# Patient Record
Sex: Male | Born: 1945 | Race: White | Hispanic: No | Marital: Married | Smoking: Former smoker
Health system: Southern US, Community
[De-identification: ages and names within clinical notes are randomized; demographics above are authoritative.]

## PROBLEM LIST (undated history)

## (undated) DIAGNOSIS — I2510 Atherosclerotic heart disease of native coronary artery without angina pectoris: Secondary | ICD-10-CM

## (undated) HISTORY — DX: Atherosclerotic heart disease of native coronary artery without angina pectoris: I25.10

---

## 2001-05-06 ENCOUNTER — Encounter: Payer: Self-pay | Admitting: *Deleted

## 2001-05-06 ENCOUNTER — Ambulatory Visit (HOSPITAL_COMMUNITY): Admission: RE | Admit: 2001-05-06 | Discharge: 2001-05-07 | Payer: Self-pay | Admitting: *Deleted

## 2001-05-06 HISTORY — PX: CARDIAC CATHETERIZATION: SHX172

## 2001-06-01 ENCOUNTER — Encounter (HOSPITAL_COMMUNITY): Admission: RE | Admit: 2001-06-01 | Discharge: 2001-06-28 | Payer: Self-pay | Admitting: *Deleted

## 2004-04-25 ENCOUNTER — Emergency Department (HOSPITAL_COMMUNITY): Admission: EM | Admit: 2004-04-25 | Discharge: 2004-04-25 | Payer: Self-pay | Admitting: Emergency Medicine

## 2004-11-25 HISTORY — PX: TRANSTHORACIC ECHOCARDIOGRAM: SHX275

## 2008-08-01 ENCOUNTER — Encounter: Admission: RE | Admit: 2008-08-01 | Discharge: 2008-08-01 | Payer: Self-pay | Admitting: Cardiovascular Disease

## 2008-08-03 ENCOUNTER — Inpatient Hospital Stay (HOSPITAL_COMMUNITY): Admission: RE | Admit: 2008-08-03 | Discharge: 2008-08-04 | Payer: Self-pay | Admitting: Cardiovascular Disease

## 2008-08-03 HISTORY — PX: CARDIAC CATHETERIZATION: SHX172

## 2010-08-29 LAB — CBC
HCT: 38.2 % — ABNORMAL LOW (ref 39.0–52.0)
MCHC: 34.5 g/dL (ref 30.0–36.0)
MCHC: 35.1 g/dL (ref 30.0–36.0)
MCV: 89.8 fL (ref 78.0–100.0)
MCV: 89.9 fL (ref 78.0–100.0)
Platelets: 214 10*3/uL (ref 150–400)
Platelets: 218 10*3/uL (ref 150–400)
RBC: 4.06 MIL/uL — ABNORMAL LOW (ref 4.22–5.81)
WBC: 6.5 10*3/uL (ref 4.0–10.5)
WBC: 7.9 10*3/uL (ref 4.0–10.5)

## 2010-08-29 LAB — BASIC METABOLIC PANEL
BUN: 9 mg/dL (ref 6–23)
Calcium: 8.4 mg/dL (ref 8.4–10.5)
Chloride: 106 mEq/L (ref 96–112)
Creatinine, Ser: 1.19 mg/dL (ref 0.4–1.5)
GFR calc Af Amer: >60 mL/min (ref >60)

## 2010-10-01 NOTE — Discharge Summary (Signed)
[name], JEFF NO.:  [id number]   MEDICAL RECORD NO.:  [id number]          PATIENT TYPE:  INP   LOCATION:  2501                         FACILITY:  [facility]   PHYSICIAN:  Nicki Guadalajara, M.D.     DATE OF BIRTH:  19-Oct-1945   DATE OF ADMISSION:  08/03/2008  DATE OF DISCHARGE:  08/04/2008                               DISCHARGE SUMMARY   ADDENDUM   Previously, the patient had been on ACE inhibitor, then had syncope and  it was discontinued, this was in 2007, so we may revisit ACE inhibitor  as an outpatient, but not currently due to syncope.  Additionally,  Protonix 40 mg 1 daily at noon.  He was actually started on this on his  office visit and also the patient is a Naval architect.  Dr. Tresa Endo does not  want him going back to work until he is seen back in the office.  We  cancelled the April 12th appointment as this would be pretty far out for  the patient and he will see Dr. Tresa Endo within 1-2 weeks and the office  will call him with the date and time.      Darcella Gasman. Annie Paras, N.P.    ______________________________  Nicki Guadalajara, M.D.    LRI/MEDQ  D:  08/04/2008  T:  08/05/2008  Job:  [id number]

## 2010-10-01 NOTE — Cardiovascular Report (Signed)
NAME:  [name], [name] NO.:  [id number]   MEDICAL RECORD NO.:  [id number]          PATIENT TYPE:  OIB   LOCATION:  2501                         FACILITY:  [facility]   PHYSICIAN:  Nicki Guadalajara, M.D.     DATE OF BIRTH:  April 03, 1946   DATE OF PROCEDURE:  08/03/2008  DATE OF DISCHARGE:                            CARDIAC CATHETERIZATION   INDICATIONS:  Mr. [name] is a 65 year old gentleman who has known  established coronary artery disease.  In 2002, he underwent stenting of  his mid right coronary artery by Dr. Katrinka Blazing.  At that time, he was also  found to have 40-50% lesion in the mid LAD, 30% lesion in the circumflex  beyond the second obtuse marginal vessel and he also had mild narrowing  beyond the stented segment in his right coronary artery.  He has been  treated medically ever since.  Recently, he has noticed recurrent  episodes of chest discomfort.  Because of symptomatology similar to  2002, definitive repeat cardiac catheterization was recommended.   PROCEDURE:  After premedication with Valium 5 mg intravenously, the  patient was prepped and draped in usual fashion.  His right femoral  artery was punctured anteriorly and a 5-French sheath was inserted  without difficulty.  Diagnostic catheterization was done utilizing 5-  [place], Judkins 4 left and right coronary catheters.  A 5-French pigtail  catheter was used for biplane cine left ventriculography as well as  distal aortography.  At this time, his cineangiograms were reviewed with  his prior study from 2002.  With the demonstration of significant  progression of LAD disease, particularly at mid LAD level but also  concern that there was high-grade ostial LAD disease, decision was made  to attempt intervention to the LAD system.  Of note, the stent in the  RCA was still patent, but he still had narrowing beyond the stented  segment which was smooth and was not felt to be his culprit.  For this  reason,  attention was directed at the LAD system.  His arterial sheath  was upgraded to a 6-French system.  He received additional Valium with  fentanyl for sedation and back discomfort.  Double bolus Integrilin and  5700 units of weight-adjusted heparin were administered.  A 600 mg of  oral Plavix was given.  During his initial part of the test, the patient  did also receive intracoronary nitroglycerin.  Next, the 4.0 LAD guide  was used and 6-French for the interventional procedure.  With the  demonstration of therapeutic anticoagulation, Prowater wire was advanced  down the LAD system.  Particularly in the spider view, it was apparent  that there was 80% eccentric ostial LAD narrowing.  For this reason, it  was felt that if predilatation was to be done this may be best done with  utilization of cutting balloon atherotomy rather than PTCA to reduce  potential for plaque shifting, particularly at the ostium.  A 2.5 x 6-mm  cutting balloon was then inserted and with some difficulty was able to  cross the ostial stenosis and was advanced to the  mid lesion.  Several  dilatations up to a maximum of 6 atmospheres were made at this mid LAD  site.  The cutting balloon was then withdrawn approximately back to the  ostium and several inflations were made with the cutting balloon at the  ostial site.  A 2.5 x 12-mm Xience V drug-eluting stent was then  inserted and advanced to the mid LAD.  Inflations were made x2 for stent  deployment up to 15 atmospheres corresponding to a 2.78-mm balloon  inflation.  The balloon was also then pulled back to the ostium and low-  level inflations were made at the ostium.  There was significant  deliberation and review as to the optimal stent size for the ostial  lesion and it was felt that a 12-mm length should cover the ostial  lesion and not the other proximal diagonal vessels.  A 2.5 x 12-mm  Xience V stent was then inserted and there was very careful attention  made  to make certain that the ostium was covered with the stent but that  the stent would not jeopardize the circumflex territory.  Stent  deployment was done x2 maximally up to 2.78 mm.  A 2.75 x 10-mm  noncompliant Dura Star balloon was used for poststent dilatation of both  lesions, mid and ostially.  Scout angiography confirmed an excellent  angiographic result at both sites.  At the completion of the procedure,  the patient as soon as he left the catheterization, received an  additional 2000 units of heparin since his ACT had dropped at the  completion of the procedure just below 200.  He is maintained on his IV  Integrilin drip with plans for sheath removal later today.   HEMODYNAMIC DATA:  Central aortic pressure 105/56.  Left ventricular  pressure was 105/9, post A-wave 17.   ANGIOGRAPHIC DATA:  Left main coronary artery was angiographically  normal and bifurcated into an LAD and left circumflex system.  There was  an ostial 80% eccentric stenosis of the LAD seen in the 1-RAO view as  well as in the spider view.  In addition, there was 80% mid stenosis  beyond the proximal bend after the first diagonal vessel.  There was  diffuse 30-40% narrowing in the distal LAD system which did improve  somewhat following IC nitroglycerin administration from being more  significantly narrowed.   The circumflex vessel was moderate size vessel which did have some mild  narrowing of 20-30% and a 20% on the bend in the vessel.  The right  coronary artery was moderate size vessel that had narrowing of 20-30% in  the mid segment.  The previously placed stent proximal to the crux was  patent.  There was smooth eccentric narrowing of 60 to less than 70%  beyond the stented segment and an area that was narrowed in 2002 and was  treated medically.  There was 30% distal RCA narrowing.   Biplane cineventriculography revealed normal LV contractility.   Distal aortography demonstrated patent renal arteries.   There was mild  luminal irregularity of the infrarenal aorta.   Following intervention to the LAD system with cutting balloon arthrotomy  and ultimate stenting of both the mid LAD and LAD ostium with a 2.5 x 12-  mm Xience stent postdilated at 2.78 mm, the 80% mid LAD lesion was  reduced to 0%.  The eccentric focal ostial lesion was also reduced from  80% to 0%.  There was brisk TIMI III flow.  There was  no evidence for  dissection.   IMPRESSION:  1. Normal left ventricular function.  2. Progressive coronary artery disease with eccentric ostial focal 80%      left anterior disease stenosis as well as 80% mid left anterior      disease stenosis with 40% diffuse distal left anterior disease      stenosis; 20% circumflex narrowing proximally; and 20-30% mid right      coronary artery narrowing with a widely patent previously stented      segment with smooth 60 to less than 70% narrowing beyond the      stented segment in the region of acute margin with distal 30% right      coronary artery narrowing beyond the posterior descending artery      takeoff.  3. Patent renal arteries with mild luminal irregularity of the      infrarenal aorta.  4. Successful percutaneous coronary intervention utilizing cutting      balloon atherotomy, percutaneous transluminal coronary angioplasty,      and stenting with the 80% mid left anterior descending stenosis      being reduced to 0% with insertion of a 2.5 x 12-mm Xience stent      postdilated to 2.78 mm, and a focal left anterior descending ostial      lesion being reduced from 80% to 0% with insertion of a 2.5 x 12-mm      Xience stent dilated to 2.78 mm done with double bolus      Integrilin/weight-adjusted heparinization/intracoronary as well as      intravenous nitroglycerin and 600 mg of oral Plavix in addition to      the patient's aspirin therapy which she had received prior to the      procedure today.            ______________________________  Nicki Guadalajara, M.D.     TK/MEDQ  D:  08/03/2008  T:  08/04/2008  Job:  [id number]   cc:   Cardiac Catheterization Laboratory

## 2010-10-01 NOTE — Discharge Summary (Signed)
NAMEIMRAAN, [name] NO.:  [id number]   MEDICAL RECORD NO.:  [id number]          PATIENT TYPE:  INP   LOCATION:  2501                         FACILITY:  [facility]   PHYSICIAN:  Nicki Guadalajara, M.D.     DATE OF BIRTH:  03-14-1946   DATE OF ADMISSION:  08/03/2008  DATE OF DISCHARGE:  08/04/2008                               DISCHARGE SUMMARY   DISCHARGE DIAGNOSES:  1. Angina associated with lightheadedness.  2. Coronary disease with previous stent to right coronary artery in      2002.      a.     Patent stent to right coronary artery, new 80% stenosis of       the proximal left anterior descending and mid left anterior       descending undergoing percutaneous transluminal coronary       angioplasty with stent deployment to both sites of the left       anterior descending with drug-eluting stent, Xience stent.      b.     Residual 60-70% stenosis, distal to the right coronary       artery but it is a smooth stenosis.      c.     Normal left ventricular function.  3. Gastroesophageal reflux disease.  4. Family history of coronary disease.  5. Dyslipidemia.  6. No tobacco use.   DISCHARGE CONDITION:  Improved.   DISCHARGE MEDICATIONS:  1. Lexapro 10 mg daily.  2. Aspirin 325 mg daily.  3. Vytorin 10/80 daily.  4. Toprol-XL we increased to 50 mg daily.  5. Isosorbide 30 mg 1 daily up from 15 mg daily.  6. Nitroglycerin 1/150 grain sublingual as needed for chest pain while      sitting, 1 every 5 minutes up to 3 every 10 minutes.  If pain      continues, call 911.  7. Please note ACE inhibitor may be added as an outpatient if blood      pressure tolerates.   DISCHARGE INSTRUCTIONS:  1. Low-sodium, heart-healthy diet.  2. Increase activity slowly.  3. May shower.  4. No lifting for 2 days.  5. No driving for 2 days.  6. Wash cath site with soap and water.  Call us if any bleeding,      swelling, or drainage.  7. Follow up with Dr. Tresa Endo on Monday, August 28, 2008, at 11:15 a.m.   HISTORY OF PRESENT ILLNESS:  Mr. [name] presented to see Dr. Tresa Endo on  July 31, 2008, with complaints of increasing chest and arm discomfort  with known coronary disease as previously stated.  He has been having  chest tightness walking fast up a hill and lightheadedness.  He also at  one point called EMS with nausea and vomiting, but EMS felt that  his  EKG was normal and he did not go to the emergency room.  He was seen by  Dr. Tresa Endo on July 31, 2008, and due to history of coronary artery  disease with previous stent and continued angina, Dr. Tresa Endo felt it was  prudent to  pursue with cardiac catheterization.  At time of 2002, he did  have mild disease of his LAD and circumflex.  He was started in the  office on Toprol, Imdur, and Protonix.   ALLERGIES:  No known allergies.   HOSPITAL COURSE:  The patient was admitted, underwent PTCA and stent  deployment with drug-eluting stent to the proximal LAD and the mid LAD  by Dr. Tresa Endo, tolerated the procedure well, was kept overnight for  observation and did well ambulating without problems this morning and  felt was ready for discharge home.  He will follow up with Dr. Tresa Endo.  We did increase his Toprol to 50 mg, and he ambulated with cardiac  rehab, unable to do phase II cardiac rehab due to his work schedule.      Darcella Gasman. Annie Paras, N.P.    ______________________________  Nicki Guadalajara, M.D.    LRI/MEDQ  D:  08/04/2008  T:  08/05/2008  Job:  [id number]

## 2010-10-04 NOTE — Cardiovascular Report (Signed)
[place]  Patient:    DENNEY, SHEIN Visit Number: [id number] MRN: [id number]          Service Type: CAT Location: [facility] 2857 01 Attending Physician:  Mora Appl Dictated by:   Meade Maw, M.D. Admit Date:  05/06/2001   CC:         Pearla Dubonnet, M.D.   Cardiac Catheterization  REFERRING PHYSICIAN:  Pearla Dubonnet, M.D.  INDICATION FOR PROCEDURE:  Early positive stress test and typical angina.  DESCRIPTION OF PROCEDURE:  After obtaining written informed consent, the patient was brought to the cardiac catheterization lab in the postabsorptive state.  Preoperative sedation was achieved using IV Versed.  The right groin was prepped and draped in the usual sterile fashion.  Local anesthesia was achieved using 1% lidocaine.  A 6-French hemostasis sheath was placed into the right femoral artery using the modified Seldinger technique.  Selective coronary angiography was performed using a JL4/JR4 Judkins catheter.  Multiple views were obtained.  Single plane ventriculogram was performed in the RAO position using a 6-French pigtail curved catheter.  FINDINGS:  Aortic pressure was 113/65.  LV pressure was 111/11.  Single plane ventriculogram revealed inferior basilar hypokinesis to akinesis.  The ejection fraction is 65%.  Coronary angiography: 1. The left main coronary artery bifurcates into the left anterior descending    and circumflex vessel.  There is no significant disease in the left main    coronary artery. 2. Left anterior descending artery:  The left anterior descending artery gives    rise to a small to moderate bifurcating D-1, a small D-2, and goes on to    end as the apical recurrent branch.  The distal left anterior descending    tapered down to a 1-mm vessel.  There was a 40-50% mid vessel lesion noted    in the left anterior descending. 3. Circumflex vessel:  The circumflex vessel is a large vessel giving rise  to    a small OM-1, moderate OM-2, and a moderate OM-3.  There was a 30% lesion    following the second obtuse marginal. 4. Right coronary artery:  The right coronary artery is a large dominant    artery.  There is a 90% mid vessel lesion noted.  The distal right coronary    artery is diffusely diseased.  There is competitive left-to-right    collaterals.  IMPRESSION: 1. Critical disease in the mid right coronary artery. 2. Borderline disease in the mid left anterior descending artery. 3. Preserved left ventricular function.  The films were reviewed with Dr. Verdis Prime who will proceed with percutaneous revascularization.  Dictated by:   Meade Maw, M.D.  Attending Physician:  Meade Maw A DD:  05/06/01 TD:  05/06/01 Job: [id number]

## 2010-10-04 NOTE — Cardiovascular Report (Signed)
[place]. [facility]  Patient:    [name], [name] Visit Number: [id number] MRN: [id number]          Service Type: CAT Location: [facility] 2857 01 Attending Physician:  Mora Appl Dictated by:   Darci Needle, M.D. Proc. Date: 05/06/01 Admit Date:  05/06/2001   CC:         Pearla Dubonnet, M.D.  Helen A. Fraser Din, M.D.   Cardiac Catheterization  INDICATION FOR PROCEDURE: Early positive exercise treadmill test, recent significant progressive anginal symptoms. The procedure is being done after identified of a high-grade right coronary stenosis by diagnostic angiography performed earlier this morning by Dr. Fraser Din.  PROCEDURE PERFORMED: Direct stent, mid right coronary artery.  DESCRIPTION OF PROCEDURE: After informed consent, we used a a 6 [place] compatible equipment. We used the sheath already placed earlier by Dr. Fraser Din during the diagnostic procedure. We also placed a 6 French sheath in the right femoral vein because of lack of IV access.  A BMW wire was used to cross the stenosis. A side-hole 6 [place] guide catheter was used and we directly stented the right coronary with a 13 mm long 3.5 mm Zeta stent to 13 atmospheres.  Two balloon inflations were performed. Affective stent diameter 3.7 mm diameter. Postprocedure TIMI flow was grade 3. Intracoronary nitroglycerin 400 mcg was given in aliquots during the procedure.  The patient received double bolus followed by an infusion of Integrilin and also received a total of 4000 units of IV heparin before the procedure was started. ACT postprocedure was 188 seconds and an additional 1000 units of heparin was administered. The patient also received 3000 mg of Plavix postprocedure.  CONCLUSIONS: Successful direct stent of the mid right coronary from 99% to 0% and improvement in TIMI flow from grade 2 to grade 3.  PLAN: Aspirin and Plavix. Integrilin times 18 hours. Further management  per Dr. Fraser Din. Dictated by:   Darci Needle, M.D. Attending Physician:  Mora Appl DD:  05/06/01 TD:  05/06/01 Job: [id number]

## 2011-09-30 HISTORY — PX: CARDIOVASCULAR STRESS TEST: SHX262

## 2012-06-08 ENCOUNTER — Encounter: Payer: Self-pay | Admitting: Internal Medicine

## 2012-06-10 ENCOUNTER — Ambulatory Visit: Payer: Self-pay | Admitting: Internal Medicine

## 2012-07-06 ENCOUNTER — Ambulatory Visit: Payer: Self-pay | Admitting: Internal Medicine

## 2012-11-26 ENCOUNTER — Telehealth: Payer: Self-pay | Admitting: Cardiovascular Disease

## 2012-11-29 NOTE — Telephone Encounter (Signed)
Returned call.  Pt stated he was supposed to get an MRI on Wednesday and he is trying to find out if everything is okay with his stents.  Pt stated something was faxed last week and he has been waiting to hear back.  Stated "they" know the name of the stents Dr. Tresa Endo put in in 2010, but they need the names of the stents put in in 2002.  Message forwarded to Dr. Pierre Bali, CMA.

## 2012-11-29 NOTE — Telephone Encounter (Signed)
Still waiting to hear from somebody! °

## 2012-11-30 ENCOUNTER — Telehealth: Payer: Self-pay | Admitting: *Deleted

## 2012-11-30 NOTE — Telephone Encounter (Signed)
Left message ok per dr Tresa Endo okay to have MRI done.

## 2012-12-02 NOTE — Telephone Encounter (Signed)
See next telephone encounter, Burna Mortimer informed pt ok to have MRI per TK

## 2012-12-03 ENCOUNTER — Other Ambulatory Visit: Payer: Self-pay | Admitting: Sports Medicine

## 2012-12-03 DIAGNOSIS — M542 Cervicalgia: Secondary | ICD-10-CM

## 2012-12-06 ENCOUNTER — Ambulatory Visit
Admission: RE | Admit: 2012-12-06 | Discharge: 2012-12-06 | Disposition: A | Discharge status: Home / Self Care | Payer: 59 | Source: Ambulatory Visit | Attending: Sports Medicine | Admitting: Sports Medicine

## 2012-12-06 DIAGNOSIS — M542 Cervicalgia: Secondary | ICD-10-CM

## 2012-12-27 ENCOUNTER — Telehealth: Payer: Self-pay | Admitting: Cardiovascular Disease

## 2012-12-27 NOTE — Telephone Encounter (Signed)
Calling regarding a clearance for an epidural.

## 2012-12-27 NOTE — Telephone Encounter (Signed)
Please call pt if you have a chance and let him know the outcome of the request.  [phone]

## 2012-12-28 NOTE — Telephone Encounter (Signed)
Pt calling back again-wants to know if Dr Tresa Endo is still his doctor.Waiting to get info so he can get his injection-he states he needs to get back to [facility] call him.

## 2012-12-28 NOTE — Telephone Encounter (Signed)
Still waiting to hear from you.Need permission to get shots in his neck and shoulder.

## 2012-12-28 NOTE — Telephone Encounter (Signed)
This message has been forwarded to Dr. Pierre Bali.  Will have to wait for response from Dr. Tresa Endo re: clearance.

## 2012-12-29 ENCOUNTER — Telehealth: Payer: Self-pay | Admitting: *Deleted

## 2012-12-29 NOTE — Telephone Encounter (Signed)
Notified patient clearance form has been faxed over to [employer].

## 2012-12-29 NOTE — Telephone Encounter (Signed)
Clearance note sent to ortho today. Patient notified.

## 2012-12-29 NOTE — Telephone Encounter (Signed)
Faxed Anticoagulant clearance to [employer].

## 2013-02-02 ENCOUNTER — Other Ambulatory Visit: Payer: Self-pay | Admitting: Cardiovascular Disease

## 2013-02-04 ENCOUNTER — Telehealth: Payer: Self-pay | Admitting: *Deleted

## 2013-02-04 NOTE — Telephone Encounter (Signed)
Faxed clearance form to [facility] for patient to get injection.

## 2013-02-04 NOTE — Telephone Encounter (Signed)
Rx was sent to pharmacy electronically. 

## 2013-04-18 ENCOUNTER — Other Ambulatory Visit: Payer: Self-pay | Admitting: Cardiovascular Disease

## 2013-04-18 NOTE — Telephone Encounter (Signed)
Please advise 

## 2013-04-27 ENCOUNTER — Encounter: Payer: Self-pay | Admitting: Internal Medicine

## 2013-05-02 ENCOUNTER — Other Ambulatory Visit: Payer: Self-pay | Admitting: Cardiovascular Disease

## 2013-05-02 NOTE — Telephone Encounter (Signed)
Rx was sent to pharmacy electronically. 

## 2013-05-30 ENCOUNTER — Other Ambulatory Visit: Payer: Self-pay | Admitting: Cardiovascular Disease

## 2013-05-30 NOTE — Telephone Encounter (Signed)
Pt called and said we are only doing prescriptions for 30 days and it should be for 90 days.

## 2013-05-31 NOTE — Telephone Encounter (Signed)
Rx(s) sent to pharmacy electronically.  

## 2013-06-01 ENCOUNTER — Ambulatory Visit: Payer: 59 | Admitting: Internal Medicine

## 2013-06-06 ENCOUNTER — Other Ambulatory Visit: Payer: Self-pay | Admitting: Cardiovascular Disease

## 2013-06-06 NOTE — Telephone Encounter (Signed)
Rx was sent to pharmacy electronically. 

## 2013-06-16 ENCOUNTER — Telehealth: Payer: Self-pay | Admitting: *Deleted

## 2013-06-16 NOTE — Telephone Encounter (Signed)
Faxed endoscopy clearance.

## 2013-06-20 ENCOUNTER — Other Ambulatory Visit: Payer: Self-pay | Admitting: Cardiovascular Disease

## 2013-06-20 ENCOUNTER — Telehealth: Payer: Self-pay | Admitting: *Deleted

## 2013-06-20 MED ORDER — ROSUVASTATIN CALCIUM 20 MG PO TABS
ORAL_TABLET | ORAL | Status: DC
Start: 2013-06-20 — End: 2013-10-11

## 2013-06-20 MED ORDER — EZETIMIBE 10 MG PO TABS: ORAL_TABLET | ORAL | Status: DC

## 2013-06-20 MED ORDER — ISOSORBIDE MONONITRATE ER 60 MG PO TB24: ORAL_TABLET | ORAL | Status: DC

## 2013-06-20 MED ORDER — CLOPIDOGREL BISULFATE 75 MG PO TABS: ORAL_TABLET | ORAL | Status: DC

## 2013-06-20 NOTE — Telephone Encounter (Signed)
Rx was sent to pharmacy electronically. 

## 2013-06-20 NOTE — Telephone Encounter (Signed)
Pt needs a 90 supply of all his medication sent to [facility]. He has an appointment on 2/27 with Dr. Tresa EndoKelly.

## 2013-06-20 NOTE — Telephone Encounter (Signed)
Need 90 days supply on the fax sent for his Zetia and Crestor.

## 2013-07-15 ENCOUNTER — Ambulatory Visit (INDEPENDENT_AMBULATORY_CARE_PROVIDER_SITE_OTHER): Payer: 59 | Admitting: Cardiovascular Disease

## 2013-07-15 ENCOUNTER — Encounter: Payer: Self-pay | Admitting: Cardiovascular Disease

## 2013-07-15 VITALS — BP 132/80 | HR 71 | Ht 71.0 in | Wt 240.5 lb

## 2013-07-15 DIAGNOSIS — E669 Obesity, unspecified: Secondary | ICD-10-CM

## 2013-07-15 DIAGNOSIS — E119 Type 2 diabetes mellitus without complications: Secondary | ICD-10-CM

## 2013-07-15 DIAGNOSIS — I2510 Atherosclerotic heart disease of native coronary artery without angina pectoris: Secondary | ICD-10-CM

## 2013-07-15 DIAGNOSIS — E785 Hyperlipidemia, unspecified: Secondary | ICD-10-CM

## 2013-07-15 DIAGNOSIS — E66811 Obesity, class 1: Secondary | ICD-10-CM | POA: Insufficient documentation

## 2013-07-15 DIAGNOSIS — Z79899 Other long term (current) drug therapy: Secondary | ICD-10-CM

## 2013-07-15 LAB — LIPID PANEL
CHOL/HDL RATIO: 2.7 ratio
Cholesterol: 125 mg/dL (ref 0–200)
HDL: 46 mg/dL (ref >39)
LDL CALC: 49 mg/dL (ref 0–99)
TRIGLYCERIDES: 149 mg/dL (ref <150)
VLDL: 30 mg/dL (ref 0–40)

## 2013-07-15 LAB — CBC
HEMATOCRIT: 41.4 % (ref 39.0–52.0)
HEMOGLOBIN: 13.9 g/dL (ref 13.0–17.0)
MCH: 29.3 pg (ref 26.0–34.0)
MCHC: 33.6 g/dL (ref 30.0–36.0)
MCV: 87.2 fL (ref 78.0–100.0)
Platelets: 269 10*3/uL (ref 150–400)
RBC: 4.75 MIL/uL (ref 4.22–5.81)
RDW: 13.1 % (ref 11.5–15.5)
WBC: 8.8 10*3/uL (ref 4.0–10.5)

## 2013-07-15 LAB — COMPREHENSIVE METABOLIC PANEL
ALT: 19 U/L (ref 0–53)
AST: 22 U/L (ref 0–37)
Albumin: 4.5 g/dL (ref 3.5–5.2)
Alkaline Phosphatase: 53 U/L (ref 39–117)
BUN: 13 mg/dL (ref 6–23)
CALCIUM: 9.6 mg/dL (ref 8.4–10.5)
CHLORIDE: 103 meq/L (ref 96–112)
CO2: 28 meq/L (ref 19–32)
CREATININE: 1.08 mg/dL (ref 0.50–1.35)
GLUCOSE: 125 mg/dL — AB (ref 70–99)
Potassium: 4.6 mEq/L (ref 3.5–5.3)
Sodium: 138 mEq/L (ref 135–145)
Total Bilirubin: 0.5 mg/dL (ref 0.2–1.2)
Total Protein: 7.2 g/dL (ref 6.0–8.3)

## 2013-07-15 NOTE — Progress Notes (Signed)
Patient ID: [name], male   DOB: [birth year], 68 y.o.   MRN: [id number]     HPI: [name] is a 68 y.o. male who presents for cardiology evaluation. I last saw him in November 2013.  [name] has documented coronary obstructive disease in 2002 initially underwent stenting to his RCA. In March 2010 he underwent cutting balloon and stenting to his LAD ostium as well as mid LAD and at that time his RCA stent was patent but he did have a 60-70% stenosis beyond the stented segment. 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 His last nuclear perfusion scan was in May 2013 which continued to show normal perfusion without scar or ischemia.  He does have a history of diabetes mellitus as well as hyperlipidemia. He has been taking Zetia 10 mg and Crestor 20 mg for his lipids. He has been taking a full dose 325 aspirin plus Plavix for his dual antiplatelet therapy. He is on isosorbide mononitrate 60 mg and Toprol-XL 25 mg for his cardiac structure disease and metformin 500 mg 3 times daily for his diabetes mellitus. He also takes over-the-counter vitamin D 2000 units daily as well as fish oil 2400 mg.  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 He has not had laboratory checked in over a year. He denies recent chest pain. He does note shortness of breath particularly with exertion. He presents now for evaluation. He tells me he needs a two-year followup nuclear perfusion study done by June to meet his DOT requirements.    No past medical history on file.   No past surgical history on file.  Allergies  Allergen Reactions  . Codeine Nausea Only    Current Outpatient Prescriptions  Medication Sig Dispense Refill  . clopidogrel (PLAVIX) 75 MG tablet Take 1 tablet by mouth every day. <MUST KEEP APPOINTMENT>  90 tablet  0    . escitalopram (LEXAPRO) 20 MG tablet Take 20 mg by mouth daily.      Marland Kitchen ezetimibe (ZETIA) 10 MG tablet Take 1 tablet by mouth every day. <MUST KEEP APPOINTMENT>  90 tablet  0  . isosorbide mononitrate (IMDUR) 60 MG 24 hr tablet Take 1 tablet by mouth every day. <MUST KEEP APPOINTMENT>  90 tablet  0  . metFORMIN (GLUCOPHAGE) 500 MG tablet Take 1 tablet by mouth 3 (three) times daily.      . metoprolol succinate (TOPROL-XL) 50 MG 24 hr tablet Take 0.5 tablets by mouth daily.      . rosuvastatin (CRESTOR) 20 MG tablet Take 1 tablet by mouth every day. <MUST KEEP APPOINTMENT>  90 tablet  0   No current facility-administered medications for this visit.    History   Social History  . Marital Status: Married    Spouse Name: N/A    Number of Children: N/A  . Years of Education: N/A   Occupational History  . Not on file.   Social History Main Topics  . Smoking status: Former Smoker -- 2.00 packs/day for 2 years    Types: Cigarettes  . Smokeless tobacco: Never Used     Comment: quit smoking in 1987  . Alcohol Use: No     Comment: quit drinking in1990.  . Drug Use: Not on file  . Sexual Activity: Not on file  Other Topics Concern  . Not on file   Social History Narrative  . No narrative on file   Social history are his 3 children 8 grandchildren 2 great-grandchildren. He does not routinely exercise. He spends many hours a day and a truck. He denies any significant awareness of weight change. No family history on file.  ROS is negative for fevers, chills or night sweats. He denies rash,. There are no headaches. He denies change in vision or hearing. He is unaware lymphadenopathy. He denies wheezing. He does note some mild shortness of breath with exertion. He is unaware of PND or orthopnea. He denies presyncope or syncope. He denies any chest tightness. He denies nausea vomiting or diarrhea. He is unaware of blood in stool or urine. He denies claudication. He denies myalgias. He denies  paresthesias. There are no tremors. He denies any major sleep issues. He is diabetic. He is unaware of thyroid abnormalities.  Other comprehensive 14 point system review is negative.  PE BP 132/80  Pulse 71  Ht _0  (1.803 m)  Wt 240 lb 8 oz (109.09 kg)  BMI 33.56 kg/m2  General: Alert, oriented, no distress.  Skin: normal turgor, no rashes HEENT: Normocephalic, atraumatic. Pupils round and reactive; sclera anicteric;no lid lag. Extraocular muscles intact;; no xanthelasmas. Nose without nasal septal hypertrophy Mouth/Parynx benign; Mallinpatti scale 3 Neck: No JVD, no carotid bruits; normal carotid upstroke Lungs: clear to ausculatation and percussion; no wheezing or rales Chest wall: no tenderness to palpitation Heart: RRR, s1 s2 normal; 1/6 systolic murmur;no diastolic murmur, rub thrills or heaves Abdomen: soft, nontender; no hepatosplenomehaly, BS+; abdominal aorta nontender and not dilated by palpation. Back: no CVA tenderness Pulses 2+ Extremities: no clubbing cyanosis or edema, Homan's sign negative  Neurologic: grossly nonfocal; cranial nerves grossly normal. Psychologic: normal affect and mood.  ECG (independently read by me): Sinus rhythm at 71 beats per minute. No significant ST changes   LABS:  BMET    Component Value Date/Time   NA 136 08/04/2008 0445   K 3.8 08/04/2008 0445   CL 106 08/04/2008 0445   CO2 24 08/04/2008 0445   GLUCOSE 136* 08/04/2008 0445   BUN 9 08/04/2008 0445   CREATININE 1.19 08/04/2008 0445   CALCIUM 8.4 08/04/2008 0445   GFRNONAA >60 08/04/2008 0445   GFRAA  Value: >60        The eGFR has been calculated using the MDRD equation. This calculation has not been validated in all clinical situations. eGFR's persistently <60 mL/min signify possible Chronic Kidney Disease. 08/04/2008 0445     Hepatic Function Panel  No results found for this basename: prot, albumin, ast, alt, alkphos, bilitot, bilidir, ibili     CBC    Component Value Date/Time     WBC 7.9 08/04/2008 0445   RBC 4.06* 08/04/2008 0445   HGB 12.8* 08/04/2008 0445   HCT 36.5* 08/04/2008 0445   PLT 218 08/04/2008 0445   MCV 89.8 08/04/2008 0445   MCHC 35.1 08/04/2008 0445   RDW 12.8 08/04/2008 0445     BNP No results found for this basename: probnp    Lipid Panel  No results found for this basename: chol, trig, hdl, cholhdl, vldl, ldlcalc     RADIOLOGY: No results found.    ASSESSMENT AND PLAN:  [name] is now 68 years old and is 13 years status post initial stenting to his RCA, and 5 years status post intervention to his LAD ostium and mid LAD. His last  nuclear perfusion study was in May 2013 which continue to be normal. His blood pressure today is controlled on his current medical regimen. I will need to obtain laboratory in a fasting state. He is tolerating Zetia and Crestor 20 mg for his hyperlipidemia I've recommended he change his aspirin to 81 mg but continue to take Plavix 75 mg. We will schedule him for LexiScan Myoview study in May 2015 for his DOT requirement stress test. Since he does develop significant shortness of breath with exercise this will be done as a pharmacologic stress test. Adjust as we made it necessary to his medical regimen once his laboratories available. I will see him back in the office in June following his stress study and further recommendations will be made at that time.    Troy Sine, MD, [facility]  07/15/2013 3:51 PM

## 2013-07-15 NOTE — Patient Instructions (Signed)
Your physician has recommended you make the following change in your medication: decrease the aspirin from 325 to 81 mg daily.  Your physician has requested that you have a lexiscan myoview. For further information please visit [email or web address]. Please follow instruction sheet, as given.  Your physician recommends that you return for lab work in: fasting.  Your physician recommends that you schedule a follow-up appointment in: June 2015.

## 2013-07-16 LAB — HEMOGLOBIN A1C
Hgb A1c MFr Bld: 7.4 % — ABNORMAL HIGH (ref <5.7)
Mean Plasma Glucose: 166 mg/dL — ABNORMAL HIGH (ref <117)

## 2013-07-16 LAB — TSH: TSH: 2.324 u[IU]/mL (ref 0.350–4.500)

## 2013-08-01 ENCOUNTER — Other Ambulatory Visit: Payer: Self-pay | Admitting: *Deleted

## 2013-08-01 MED ORDER — ISOSORBIDE MONONITRATE ER 60 MG PO TB24: ORAL_TABLET | ORAL | Status: DC

## 2013-08-01 NOTE — Telephone Encounter (Signed)
Rx was sent to pharmacy electronically. 

## 2013-08-05 ENCOUNTER — Encounter (HOSPITAL_COMMUNITY): Payer: 59

## 2013-08-15 ENCOUNTER — Telehealth: Payer: Self-pay | Admitting: *Deleted

## 2013-08-15 ENCOUNTER — Telehealth: Payer: Self-pay | Admitting: Cardiovascular Disease

## 2013-08-15 ENCOUNTER — Encounter: Payer: Self-pay | Admitting: *Deleted

## 2013-08-15 NOTE — Telephone Encounter (Signed)
Returning your call about his lab results.

## 2013-08-15 NOTE — Telephone Encounter (Signed)
Called to give lab results. No answer. Will mail letter.

## 2013-08-16 NOTE — Telephone Encounter (Signed)
Returned a call to the patient, however is wife informs me the patient is sleeping. Informed her that I was returning a call from yesterday to give him his lab results. Asked if she would tell him that since I did to reach him on yesterday that I have sent out a letter to him. He should receive this in the next day or so with his results. If he has any questions after receiving the letter he can feel free to call me back. Wife expressed her understanding and will relay this information to the patient.

## 2013-08-22 ENCOUNTER — Encounter (HOSPITAL_COMMUNITY): Payer: Self-pay | Admitting: *Deleted

## 2013-09-21 ENCOUNTER — Telehealth: Payer: Self-pay | Admitting: Cardiovascular Disease

## 2013-09-21 NOTE — Telephone Encounter (Signed)
Returned call and pt verified x 2.  Pt informed message received and advised he contact his PCP for prescription.  Pt stated he did and they haven't responded.  Pt advised to call them back or have pharmacy call on his behalf.  Pt verbalized understanding and agreed w/ plan.

## 2013-09-21 NOTE — Telephone Encounter (Signed)
Pt wants you to call in his diabetic strips. I told him that I think his primary docto rhad to do that.

## 2013-10-07 ENCOUNTER — Telehealth (HOSPITAL_COMMUNITY): Payer: Self-pay

## 2013-10-11 ENCOUNTER — Telehealth: Payer: Self-pay | Admitting: *Deleted

## 2013-10-11 ENCOUNTER — Ambulatory Visit (HOSPITAL_COMMUNITY)
Admission: RE | Admit: 2013-10-11 | Discharge: 2013-10-11 | Disposition: A | Discharge status: Home / Self Care | Payer: 59 | Source: Ambulatory Visit | Attending: Cardiovascular Disease | Admitting: Cardiovascular Disease

## 2013-10-11 DIAGNOSIS — I2510 Atherosclerotic heart disease of native coronary artery without angina pectoris: Secondary | ICD-10-CM

## 2013-10-11 DIAGNOSIS — R0609 Other forms of dyspnea: Secondary | ICD-10-CM | POA: Insufficient documentation

## 2013-10-11 DIAGNOSIS — R0989 Other specified symptoms and signs involving the circulatory and respiratory systems: Principal | ICD-10-CM | POA: Insufficient documentation

## 2013-10-11 MED ORDER — ROSUVASTATIN CALCIUM 20 MG PO TABS: ORAL_TABLET | ORAL | Status: DC

## 2013-10-11 MED ORDER — TECHNETIUM TC 99M SESTAMIBI GENERIC - CARDIOLITE
30.0000 | Freq: Once | INTRAVENOUS | Status: AC | PRN
Start: 2013-10-11 — End: 2013-10-11
  Administered 2013-10-11: 30 via INTRAVENOUS

## 2013-10-11 MED ORDER — TECHNETIUM TC 99M SESTAMIBI GENERIC - CARDIOLITE
10.0000 | Freq: Once | INTRAVENOUS | Status: AC | PRN
  Administered 2013-10-11: 10 via INTRAVENOUS

## 2013-10-11 MED ORDER — CLOPIDOGREL BISULFATE 75 MG PO TABS: ORAL_TABLET | ORAL | Status: DC

## 2013-10-11 MED ORDER — EZETIMIBE 10 MG PO TABS: ORAL_TABLET | ORAL | Status: DC

## 2013-10-11 MED ORDER — REGADENOSON 0.4 MG/5ML IV SOLN
0.4000 mg | Freq: Once | INTRAVENOUS | Status: AC
  Administered 2013-10-11: 0.4 mg via INTRAVENOUS

## 2013-10-11 NOTE — Telephone Encounter (Signed)
Patient had stress test today and is requesting a letter to take stating he had this test so he can have DOT physical. Letter composed and given to patient.  Patient also requested refills of crestor, zetia, plavix Rx was sent to pharmacy electronically.

## 2013-10-11 NOTE — Procedures (Addendum)
[facility] [address] [place] [address] [phone]  Cardiology Nuclear Med Study  [name] is a 68 y.o. male     MRN : [id number]     DOB: Mar 07, 1946  Procedure Date: 10/11/2013  Nuclear Med Background Indication for Stress Test:  Stent Patency History:  No prior respiratory history reported;CAD;STENT/PTCA-2002 AND 07/2008;Last NUC MPI on 09/30/2011-nonischemic;EF=58% Cardiac Risk Factors: Family History - CAD, History of Smoking, Hypertension, Lipids, NIDDM, Obesity and Smoker  Symptoms:  DOE   Nuclear Pre-Procedure Caffeine/Decaff Intake:  1:00am NPO After: 11am   IV Site: R Hand  IV 0.9% NS with Angio Cath:  22g  Chest Size (in):  48"  IV Started by: Berdie Ogren, RN  Height: 5\' 11"  (1.803 m)  Cup Size: n/a  BMI:  Body mass index is 33.49 kg/(m^2). Weight:  240 lb (108.863 kg)   Tech Comments:  n/a    Nuclear Med Study 1 or 2 day study: 1 day  Stress Test Type:  Lexiscan  Order Authorizing Provider:  Nicki Guadalajara, MD   Resting Radionuclide: Technetium 75m Sestamibi  Resting Radionuclide Dose: 10.2 mCi   Stress Radionuclide:  Technetium 31m Sestamibi  Stress Radionuclide Dose: 30.9 mCi           Stress Protocol Rest HR: 72 Stress HR: 85  Rest BP: 112/81 Stress BP: 126/71  Exercise Time (min): n/a METS: n/a   Predicted Max HR: 153 bpm % Max HR: 55.56 bpm Rate Pressure Product: 09381  Dose of Adenosine (mg):  n/a Dose of Lexiscan: 0.4 mg  Dose of Atropine (mg): n/a Dose of Dobutamine: n/a mcg/kg/min (at max HR)  Stress Test Technologist: Esperanza Sheets, CCT Nuclear Technologist: Koren Shiver, CNMT   Rest Procedure:  Myocardial perfusion imaging was performed at rest 45 minutes following the intravenous administration of Technetium 71m Sestamibi. Stress Procedure:  The patient received IV Lexiscan 0.4 mg over 15-seconds.  Technetium 23m Sestamibi injected  IVat 30-seconds.  There were no  significant changes with Lexiscan.  Quantitative spect images were obtained after a 45 minute delay.  Transient Ischemic Dilatation (Normal <1.22):  1.37 Lung/Heart Ratio (Normal <0.45):  0.37 QGS EDV:  51 ml QGS ESV:  16 ml LV Ejection Fraction: 69%        Rest ECG: NSR - Normal EKG  Stress ECG: No significant change from baseline ECG  QPS Raw Data Images:  Normal; no motion artifact; normal heart/lung ratio. Stress Images:  Normal homogeneous uptake in all areas of the myocardium. Rest Images:  Normal homogeneous uptake in all areas of the myocardium. Subtraction (SDS):  No evidence of ischemia.  Impression Exercise Capacity:  Lexiscan with no exercise. BP Response:  Normal blood pressure response. Clinical Symptoms:  No significant symptoms noted. ECG Impression:  No significant ST segment change suggestive of ischemia. Comparison with Prior Nuclear Study: No significant change from previous study  Overall Impression:  Normal stress nuclear study.  LV Wall Motion:  NL LV Function; NL Wall Motion   Runell Gess, MD  10/11/2013 5:11 PM

## 2013-10-12 ENCOUNTER — Telehealth: Payer: Self-pay | Admitting: *Deleted

## 2013-10-12 ENCOUNTER — Encounter: Payer: Self-pay | Admitting: *Deleted

## 2013-10-12 ENCOUNTER — Telehealth: Payer: Self-pay | Admitting: Cardiovascular Disease

## 2013-10-12 NOTE — Telephone Encounter (Signed)
Please call,he need to discuss some of the things he need for DOT.

## 2013-10-12 NOTE — Telephone Encounter (Signed)
Notified patient Dr. Rennis Golden cleared him for his DOT. Note and copy of stress test faxed to [facility] @ [phone] attention Robyne Peers. Patient also informed stress test was normal. Keep 10/28/13 appointment as scheduled. ( Phone number to [facility] [phone].

## 2013-10-12 NOTE — Telephone Encounter (Signed)
Patient called to inform that he needs a note from Dr. Tresa Endo stating okay for him to drive. 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 Informed the patient that Dr. Tresa Endo is out on vacation this week. I will try to get someone else to approve for him drive based on his last office visit and the stress test results that he had done yesterday.

## 2013-10-13 ENCOUNTER — Telehealth: Payer: Self-pay | Admitting: Cardiovascular Disease

## 2013-10-13 NOTE — Telephone Encounter (Signed)
[facility] requested a recent A1c. Informed them that we do not manage his diabetes. Per phone conversation with patient on yesterday he has appointment today with his PCP for evaluation.

## 2013-10-21 ENCOUNTER — Encounter (HOSPITAL_COMMUNITY): Payer: 59

## 2013-10-27 ENCOUNTER — Encounter: Payer: Self-pay | Admitting: Cardiovascular Disease

## 2013-10-28 ENCOUNTER — Ambulatory Visit: Payer: 59 | Admitting: Cardiovascular Disease

## 2013-11-10 ENCOUNTER — Encounter (HOSPITAL_COMMUNITY): Payer: 59

## 2013-11-16 NOTE — Telephone Encounter (Signed)
Encounter complete. 

## 2013-11-17 NOTE — Telephone Encounter (Signed)
Encounter complete. 

## 2013-11-24 NOTE — Telephone Encounter (Signed)
Encounter complete. 

## 2014-01-24 ENCOUNTER — Other Ambulatory Visit: Payer: Self-pay | Admitting: Cardiovascular Disease

## 2014-01-24 NOTE — Telephone Encounter (Signed)
Rx was sent to pharmacy electronically. 

## 2014-02-14 ENCOUNTER — Other Ambulatory Visit: Payer: Self-pay | Admitting: Cardiovascular Disease

## 2014-02-14 NOTE — Telephone Encounter (Signed)
Rx was sent to pharmacy electronically. 

## 2014-03-21 ENCOUNTER — Ambulatory Visit: Payer: 59 | Admitting: Internal Medicine

## 2014-04-24 ENCOUNTER — Other Ambulatory Visit: Payer: Self-pay | Admitting: Cardiovascular Disease

## 2014-04-25 ENCOUNTER — Telehealth: Payer: Self-pay | Admitting: Cardiovascular Disease

## 2014-04-25 MED ORDER — ROSUVASTATIN CALCIUM 20 MG PO TABS: 20.0000 mg | ORAL_TABLET | Freq: Every day | ORAL | Status: DC

## 2014-04-25 NOTE — Telephone Encounter (Signed)
Pt is calling in stating that he is out of his Crestor and would like it called in a refill at the [facility] in [place]. Please call Thanks

## 2014-04-25 NOTE — Telephone Encounter (Signed)
Rx was sent to pharmacy electronically. 

## 2014-04-25 NOTE — Telephone Encounter (Signed)
Pt still have not received his Crestor 20 mg. Please call [facility] in today to [phone]

## 2014-04-25 NOTE — Telephone Encounter (Signed)
Verified med with patient, called in rx. Reminded pt to schedule an appt with Dr. Tresa EndoKelly (last OV 07/14/13), will send to scheduling pool.

## 2014-07-17 ENCOUNTER — Ambulatory Visit: Payer: 59 | Admitting: Cardiovascular Disease

## 2014-07-18 ENCOUNTER — Other Ambulatory Visit: Payer: Self-pay

## 2014-07-18 MED ORDER — ISOSORBIDE MONONITRATE ER 60 MG PO TB24: 60.0000 mg | ORAL_TABLET | Freq: Every day | ORAL | Status: DC

## 2014-07-18 NOTE — Telephone Encounter (Signed)
Rx(s) sent to pharmacy electronically.  

## 2014-07-20 ENCOUNTER — Telehealth: Payer: Self-pay | Admitting: Cardiovascular Disease

## 2014-07-20 MED ORDER — ISOSORBIDE MONONITRATE ER 60 MG PO TB24: 60.0000 mg | ORAL_TABLET | Freq: Every day | ORAL | Status: DC

## 2014-07-20 NOTE — Telephone Encounter (Signed)
°  1. Which medications need to be refilled?Isosorbide 2. Which pharmacy is [id number]  3. Do they need a 30 day or 90 day supply? 90 always please and refills  4. Would they like a call back once the medication has been sent to the pharmacy? no

## 2014-07-20 NOTE — Telephone Encounter (Signed)
90 day supply sent in, spoke to patient to confirm that appointment in April must be kept.

## 2014-08-14 ENCOUNTER — Other Ambulatory Visit: Payer: Self-pay | Admitting: Cardiovascular Disease

## 2014-08-14 NOTE — Telephone Encounter (Signed)
°  1. Which medications need to be refilled? Levofloxacin and Metoprolol 2. Which pharmacy [phone] 3. Do they need a 30 day or 90 day supply? 90 and refills 4. Would they like a call back once the medication has been sent to the pharmacy? yes

## 2014-08-14 NOTE — Telephone Encounter (Signed)
LMTCB

## 2014-08-15 MED ORDER — METOPROLOL SUCCINATE ER 50 MG PO TB24: ORAL_TABLET | ORAL | Status: DC

## 2014-08-15 MED ORDER — CLOPIDOGREL BISULFATE 75 MG PO TABS: 75.0000 mg | ORAL_TABLET | Freq: Every day | ORAL | Status: DC

## 2014-08-15 NOTE — Telephone Encounter (Signed)
Received a call from patient he stated he called yesterday 08/14/14 and told operator wrong medications he needed refills for.Stated he needed 90 day refills for metoprolol and plavix.Advised I will send in 90 day supply to pharmacy.[facility] 2:45 pm and additional refills will be added to prescriptions at that visit.

## 2014-08-29 ENCOUNTER — Ambulatory Visit: Payer: Self-pay | Admitting: Cardiovascular Disease

## 2014-09-21 ENCOUNTER — Other Ambulatory Visit: Payer: Self-pay | Admitting: *Deleted

## 2014-09-21 MED ORDER — ISOSORBIDE MONONITRATE ER 60 MG PO TB24: 60.0000 mg | ORAL_TABLET | Freq: Every day | ORAL | Status: DC

## 2014-09-21 NOTE — Telephone Encounter (Signed)
Rx(s) sent to pharmacy electronically. OV 09/25/2014

## 2014-09-25 ENCOUNTER — Telehealth: Payer: Self-pay | Admitting: Cardiovascular Disease

## 2014-09-25 ENCOUNTER — Encounter: Payer: Self-pay | Admitting: Cardiovascular Disease

## 2014-09-25 ENCOUNTER — Ambulatory Visit (INDEPENDENT_AMBULATORY_CARE_PROVIDER_SITE_OTHER): Payer: 59 | Admitting: Cardiovascular Disease

## 2014-09-25 VITALS — BP 118/80 | HR 85 | Ht 71.0 in | Wt 232.7 lb

## 2014-09-25 DIAGNOSIS — E669 Obesity, unspecified: Secondary | ICD-10-CM

## 2014-09-25 DIAGNOSIS — I25810 Atherosclerosis of coronary artery bypass graft(s) without angina pectoris: Secondary | ICD-10-CM

## 2014-09-25 DIAGNOSIS — E785 Hyperlipidemia, unspecified: Secondary | ICD-10-CM

## 2014-09-25 DIAGNOSIS — E1059 Type 1 diabetes mellitus with other circulatory complications: Secondary | ICD-10-CM | POA: Diagnosis not present

## 2014-09-25 DIAGNOSIS — E1159 Type 2 diabetes mellitus with other circulatory complications: Secondary | ICD-10-CM | POA: Insufficient documentation

## 2014-09-25 MED ORDER — ISOSORBIDE MONONITRATE ER 60 MG PO TB24: 60.0000 mg | ORAL_TABLET | Freq: Every day | ORAL | Status: DC

## 2014-09-25 NOTE — Progress Notes (Signed)
Patient ID: [name], male   DOB: [birth year], 69 y.o.   MRN: [id number]     HPI: [name] is a 69 y.o. male who presents for a 15 month follow-upcardiology evaluation.  [name] has documented CAD and in 2002 initially underwent stenting to his RCA. In March 2010 he underwent cutting balloon and stenting to his LAD ostium as well as mid LAD and at that time his RCA stent was patent but he did have a 60-70% stenosis beyond the stented segment. 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 His last nuclear perfusion scan was on Oct 11, 2013 which continued to show normal perfusion without scar or ischemia.  He has a history of diabetes mellitus as well as hyperlipidemia. He has been taking Zetia 10 mg and Crestor 20 mg for his lipids. Last year he was  taking a full dose 325 aspirin plus Plavix for his dual antiplatelet therapy.  I had recommended reduction of his aspirin to 81 mg.  Apparently he decided to stop his aspirin altogether. He is on isosorbide mononitrate 60 mg and Toprol-XL 25 mg for his cardiac structure disease and metformin 500 mg 3 times daily for his diabetes mellitus. He also takes over-the-counter vitamin D 2000 units daily as well as fish oil 2400 mg.  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 History has changed and now he drives 725 miles daily to just outside [place].  He tells me he will lose his permit to drive unless he has a nuclear perfusion study the day after one year from his last study.  He is requesting that this be done in exercise format on 10/13/2014.    Past Medical History  Diagnosis Date  . Coronary artery disease      Past Surgical History  Procedure Laterality Date  . Cardiac catheterization  08/03/2008    Cutting balloon atherotomy and stenting of LAD and mid  LAD. Stented with a 2.5x72m Xience and posrdilated at 2.78. The 80% lesion was reduced to 0%.  . Cardiac catheterization  05/06/2001    Stenting of 99% mid RCA lesion performed with a 3.5x166mZeta stent at 13 atm resulting in reduction of 99% stenosis to 0% with TIMI2-3 flow.  . Cardiovascular stress test  09/30/2011    No significant reversible perfusion defects. No ECG changes, non-diagnostic for ischemia.  . Transthoracic echocardiogram  11/25/2004    EF 50-55%, mild mitral valve regurg, mild tricuspid valve regurg, LA-mildy dilated    Allergies  Allergen Reactions  . Codeine Nausea Only    Current Outpatient Prescriptions  Medication Sig Dispense Refill  . aspirin 81 MG tablet Take 81 mg by mouth daily.    . cholecalciferol (VITAMIN D) 1000 UNITS tablet Take 1,000 Units by mouth daily.    . clopidogrel (PLAVIX) 75 MG tablet Take 1 tablet (75 mg total) by mouth daily. 90 tablet 0  . escitalopram (LEXAPRO) 20 MG tablet Take 20 mg by mouth daily.    . metFORMIN (GLUCOPHAGE) 500 MG tablet Take 1 tablet by mouth 3 (three) times daily.    . metoprolol succinate (TOPROL-XL) 50 MG 24 hr tablet Take 1/2 tablet daily 45 tablet 0  . rosuvastatin (CRESTOR) 20 MG tablet Take 1 tablet (20 mg total) by mouth daily. 90Nubieber  tablet 0  . isosorbide mononitrate (IMDUR) 60 MG 24 hr tablet Take 1 tablet (60 mg total) by mouth daily. 90 tablet 3   No current facility-administered medications for this visit.    History   Social History  . Marital Status: Married    Spouse Name: N/A  . Number of Children: N/A  . Years of Education: N/A   Occupational History  . Not on file.   Social History Main Topics  . Smoking status: Former Smoker -- 2.00 packs/day for 2 years    Types: Cigarettes  . Smokeless tobacco: Never Used     Comment: quit smoking in 1987  . Alcohol Use: No     Comment: quit drinking in1990.  . Drug Use: Not on file  . Sexual Activity: Not on file   Other Topics Concern  . Not on  file   Social History Narrative   Social history are his 3 children 8 grandchildren 2 great-grandchildren. He does not routinely exercise. He spends many hours a day and a truck. He denies any significant awareness of weight change. Family History  Problem Relation Age of Onset  . Heart disease Father    ROS General: Negative; No fevers, chills, or night sweats;  HEENT: Negative; No changes in vision or hearing, sinus congestion, difficulty swallowing Pulmonary: Negative; No cough, wheezing, shortness of breath, hemoptysis Cardiovascular: Negative; No chest pain, presyncope, syncope, palpitations GI: Negative; No nausea, vomiting, diarrhea, or abdominal pain GU: Negative; No dysuria, hematuria, or difficulty voiding Musculoskeletal: Negative; no myalgias, joint pain, or weakness Hematologic/Oncology: Negative; no easy bruising, bleeding Endocrine:  Positive for diabetes Neuro: Negative; no changes in balance, headaches Skin: Negative; No rashes or skin lesions Psychiatric: Negative; No behavioral problems, depression Sleep: Negative; No snoring, daytime sleepiness, hypersomnolence, bruxism, restless legs, hypnogognic hallucinations, no cataplexy Other comprehensive 14 point system review is negative.   BP 118/80 mmHg  Pulse 85  Ht '5\' 11"'  (1.803 m)  Wt 232 lb 11.2 oz (105.552 kg)  BMI 32.47 kg/m2   Wt Readings from Last 3 Encounters:  09/25/14 232 lb 11.2 oz (105.552 kg)  07/15/13 240 lb 8 oz (109.09 kg)   General: Alert, oriented, no distress.  Skin: normal turgor, no rashes HEENT: Normocephalic, atraumatic. Pupils round and reactive; sclera anicteric;no lid lag. Extraocular muscles intact;; no xanthelasmas. Nose without nasal septal hypertrophy Mouth/Parynx benign; Mallinpatti scale 3 Neck: No JVD, no carotid bruits; normal carotid upstroke Lungs: clear to ausculatation and percussion; no wheezing or rales Chest wall: no tenderness to palpitation Heart: RRR, s1 s2 normal;  1/6 systolic murmur;no diastolic murmur, rub thrills or heaves Abdomen: soft, nontender; no hepatosplenomehaly, BS+; abdominal aorta nontender and not dilated by palpation. Back: no CVA tenderness Pulses 2+ Extremities: no clubbing cyanosis or edema, Homan's sign negative  Neurologic: grossly nonfocal; cranial nerves grossly normal. Psychologic: normal affect and mood.  ECG (independently read by me): sinus rhythm at 85 bpm.  ECG (independently read by me): Sinus rhythm at 71 beats per minute. No significant ST changes   LABS: BMP Latest Ref Rng 07/15/2013 08/04/2008  Glucose 70 - 99 mg/dL 125(H) 136(H)  BUN 6 - 23 mg/dL 13 9  Creatinine 0.50 - 1.35 mg/dL 1.08 1.19  Sodium 135 - 145 mEq/L 138 136  Potassium 3.5 - 5.3 mEq/L 4.6 3.8  Chloride 96 - 112 mEq/L 103 106  CO2 19 - 32 mEq/L 28 24  Calcium 8.4 - 10.5 mg/dL 9.6 8.4   Hepatic Function Latest Ref Rng  07/15/2013  Total Protein 6.0 - 8.3 g/dL 7.2  Albumin 3.5 - 5.2 g/dL 4.5  AST 0 - 37 U/L 22  ALT 0 - 53 U/L 19  Alk Phosphatase 39 - 117 U/L 53  Total Bilirubin 0.2 - 1.2 mg/dL 0.5   CBC Latest Ref Rng 07/15/2013 08/04/2008 08/03/2008  WBC 4.0 - 10.5 K/uL 8.8 7.9 6.5  Hemoglobin 13.0 - 17.0 g/dL 13.9 12.8(L) 13.1  Hematocrit 39.0 - 52.0 % 41.4 36.5(L) 38.2(L)  Platelets 150 - 400 K/uL 269 218 214   Lab Results  Component Value Date   TSH 2.324 07/15/2013   Lab Results  Component Value Date   HGBA1C 7.4* 07/15/2013   Lipid Panel     Component Value Date/Time   CHOL 125 07/15/2013 1615   TRIG 149 07/15/2013 1615   HDL 46 07/15/2013 1615   CHOLHDL 2.7 07/15/2013 1615   VLDL 30 07/15/2013 1615   LDLCALC 49 07/15/2013 1615    RADIOLOGY: No results found.    ASSESSMENT AND PLAN:  Mr. [name] is now 69 years old and is 14 years status post initial stenting to his RCA, and 6 years status post intervention to his LAD ostium and mid LAD.   His blood pressure today is controlled on his current medical regimen  Of  Toprol-XL 25 mg, in addition to his 60 mg, isosorbide.  He continues to be on Plavix 75 mg but stopped taking his aspirin.  I have recommended that he start aspirin 81 mg.  Remotely had been on 325 mg. He continues to be on vitamin D supplementation.  He has hyperlipidemia and is tolerating Crestor 20 mg without myalgias. Last year he was on both Zetia and Crestor.  He is not had recent laboratory.  I will schedule him for lab work to be obtained.  He tells me that he needs to have an Myoview study on 10/13/1998 16.  We will schedule this as an exercise Myoview scan to meet his Department of Transportation requirements.  He tells me his medical clearance will expire on May 26  , and he will need a follow-up visit with his  Primary MD who provides his DOT licensing.    time spent: 25 minutes Troy Sine, MD, [facility]  09/25/2014 7:03 PM

## 2014-09-25 NOTE — Patient Instructions (Signed)
Your physician has requested that you have en exercise stress myoview. His will be scheduled on May 27th.   Your physician has recommended you make the following change in your medication: start a 81 mg aspirin.  Your physician wants you to follow-up in: 1 year or sooner if needed. You will receive a reminder letter in the mail two months in advance. If you don't receive a letter, please call our office to schedule the follow-up appointment.

## 2014-09-25 NOTE — Telephone Encounter (Signed)
°  1. Which medications need to be refilled? Isosorbide  2. Which pharmacy is medication to be sent to?[facility] [place]  3. Do they need a 30 day or 90 day supply? He would like a 90 day supply  4. Would they like a call back once the medication has been sent to the pharmacy? yes

## 2014-09-25 NOTE — Telephone Encounter (Signed)
Refill submitted to patient's preferred pharmacy.  

## 2014-10-11 ENCOUNTER — Telehealth (HOSPITAL_COMMUNITY): Payer: Self-pay

## 2014-10-11 NOTE — Telephone Encounter (Signed)
Encounter complete. 

## 2014-10-12 ENCOUNTER — Telehealth (HOSPITAL_COMMUNITY): Payer: Self-pay

## 2014-10-12 NOTE — Telephone Encounter (Signed)
Encounter complete. 

## 2014-10-13 ENCOUNTER — Ambulatory Visit (HOSPITAL_COMMUNITY)
Admission: RE | Admit: 2014-10-13 | Discharge: 2014-10-13 | Disposition: A | Discharge status: Home / Self Care | Payer: 59 | Source: Ambulatory Visit | Attending: Cardiology | Admitting: Cardiology

## 2014-10-13 DIAGNOSIS — I25810 Atherosclerosis of coronary artery bypass graft(s) without angina pectoris: Secondary | ICD-10-CM | POA: Insufficient documentation

## 2014-10-13 LAB — MYOCARDIAL PERFUSION IMAGING
CHL CUP MPHR: 152 {beats}/min
CHL CUP NUCLEAR SDS: 5
CHL RATE OF PERCEIVED EXERTION: 27054
CSEPED: 6 min
CSEPEW: 7 METS
LV dias vol: 73 mL
LV sys vol: 25 mL
NUC STRESS TID: 1.02
Nuc Stress EF: 66 %
Peak HR: 162 {beats}/min
Percent HR: 106 %
Rest HR: 76 {beats}/min
SRS: 2
SSS: 5

## 2014-10-13 MED ORDER — TECHNETIUM TC 99M SESTAMIBI GENERIC - CARDIOLITE
10.5000 | Freq: Once | INTRAVENOUS | Status: AC | PRN
  Administered 2014-10-13: 11 via INTRAVENOUS

## 2014-10-13 MED ORDER — TECHNETIUM TC 99M SESTAMIBI GENERIC - CARDIOLITE
31.0000 | Freq: Once | INTRAVENOUS | Status: AC | PRN
  Administered 2014-10-13: 31 via INTRAVENOUS

## 2014-10-17 ENCOUNTER — Telehealth: Payer: Self-pay | Admitting: *Deleted

## 2014-10-17 ENCOUNTER — Encounter: Payer: Self-pay | Admitting: *Deleted

## 2014-10-17 NOTE — Telephone Encounter (Signed)
Called patient and notified him of stress test results. 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

## 2014-10-23 ENCOUNTER — Telehealth: Payer: Self-pay | Admitting: Cardiovascular Disease

## 2014-10-23 NOTE — Telephone Encounter (Signed)
°  1. Which medications need to be refilled? Isosorbide  2. Which pharmacy is medication [facility] to?[phone]  3. Do they need a 30 day or 90 day supply? 90 and refills  4. Would they like a call back once the medication has been sent to the pharmacy? no

## 2014-10-23 NOTE — Telephone Encounter (Signed)
Isosorbide refilled as denoted below.  90 tablet 3 09/25/2014   Confirmed this with pharmacy

## 2014-10-24 ENCOUNTER — Encounter: Payer: Self-pay | Admitting: Cardiovascular Disease

## 2014-10-30 ENCOUNTER — Telehealth: Payer: Self-pay | Admitting: Cardiovascular Disease

## 2014-10-30 MED ORDER — ROSUVASTATIN CALCIUM 20 MG PO TABS: 20.0000 mg | ORAL_TABLET | Freq: Every day | ORAL | Status: AC

## 2014-10-30 NOTE — Telephone Encounter (Signed)
Refill sent to pt's preferred pharmacy.

## 2014-10-30 NOTE — Telephone Encounter (Signed)
°  1. Which medications need to be refilled?Crestor  2. Which pharmacy is medication to be sent to?[facility] [place]  3. Do they need a 30 day or 90 day supply? 90  4. Would they like a call back once the medication has been sent to the pharmacy? No

## 2014-11-10 ENCOUNTER — Telehealth: Payer: Self-pay | Admitting: Cardiovascular Disease

## 2014-11-10 MED ORDER — CLOPIDOGREL BISULFATE 75 MG PO TABS: 75.0000 mg | ORAL_TABLET | Freq: Every day | ORAL | Status: DC

## 2014-11-10 MED ORDER — METOPROLOL SUCCINATE ER 50 MG PO TB24: ORAL_TABLET | ORAL | Status: DC

## 2014-11-10 NOTE — Telephone Encounter (Signed)
Rx(s) sent to pharmacy electronically.  

## 2014-11-10 NOTE — Telephone Encounter (Signed)
°  1. Which medications need to be refilled? Metoprolol,Clopidogrel  2. Which [phone]  3. Do they need a 30 day or 90 day supply? #90 and refills for both  4. Would they like a call back once the medication has been sent to the pharmacy? no

## 2015-08-07 ENCOUNTER — Telehealth: Payer: Self-pay | Admitting: Cardiovascular Disease

## 2015-08-07 NOTE — Telephone Encounter (Signed)
Error ° °Did not need to start encounter  °

## 2015-09-10 ENCOUNTER — Ambulatory Visit: Payer: 59 | Admitting: Cardiovascular Disease

## 2015-10-10 ENCOUNTER — Ambulatory Visit (INDEPENDENT_AMBULATORY_CARE_PROVIDER_SITE_OTHER): Payer: 59 | Admitting: Cardiovascular Disease

## 2015-10-10 ENCOUNTER — Encounter: Payer: Self-pay | Admitting: Cardiovascular Disease

## 2015-10-10 VITALS — BP 136/82 | HR 74 | Ht 71.0 in | Wt 237.2 lb

## 2015-10-10 DIAGNOSIS — I2510 Atherosclerotic heart disease of native coronary artery without angina pectoris: Secondary | ICD-10-CM

## 2015-10-10 DIAGNOSIS — E119 Type 2 diabetes mellitus without complications: Secondary | ICD-10-CM

## 2015-10-10 DIAGNOSIS — E785 Hyperlipidemia, unspecified: Secondary | ICD-10-CM | POA: Diagnosis not present

## 2015-10-10 DIAGNOSIS — E669 Obesity, unspecified: Secondary | ICD-10-CM

## 2015-10-10 MED ORDER — METOPROLOL SUCCINATE ER 50 MG PO TB24: ORAL_TABLET | ORAL | Status: DC

## 2015-10-10 NOTE — Patient Instructions (Signed)
Your physician has recommended you make the following change in your medication:   1.) the metoprolol has been changed to 1/2 tablet alternating with 1 tablet every other day.   Your physician wants you to follow-up in: 1 year or sooner if needed. You will receive a reminder letter in the mail two months in advance. If you don't receive a letter, please call our office to schedule the follow-up appointment.   If you need a refill on your cardiac medications before your next appointment, please call your pharmacy.

## 2015-10-12 ENCOUNTER — Encounter: Payer: Self-pay | Admitting: Cardiovascular Disease

## 2015-10-12 NOTE — Progress Notes (Signed)
Patient ID: [name], male   DOB: 07-Jan-1946, 70 y.o.   MRN: [id number]    Primary M.D.: Dr. Gilford Rile  HPI: [name] is a 70 y.o. male who presents for a 12 month follow-upcardiology evaluation.  [name] has CAD and in 2002 initially underwent stenting to his RCA. In March 2010 he underwent cutting balloon and stenting to his LAD ostium as well as mid LAD and at that time his RCA stent was patent but he did have a 60-70% stenosis beyond the stented segment. 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 His last nuclear perfusion scan was on Oct 13, 2014 which continued to show normal perfusion without scar or ischemia.  He developed mild ST segment changes and had occasional PVCs but scintigraphic images continues to show entirely normal perfusion without evidence for scar or ischemia.  Post stress ejection fraction was 66% and he had normal wall motion  He has a history of diabetes mellitus as well as hyperlipidemia. He has been taking Crestor 20 mg daily and is no longer taking Zetia for his lipids.  Previously he was  taking a full dose 325 aspirin plus Plavix for his dual antiplatelet therapy.  I had recommended reduction of his aspirin to 81 mg.  Apparently he decided to stop his aspirin altogether. He is on isosorbide mononitrate 60 mg and Toprol-XL 25 mg for his cardiac structure disease and he continues to take metformin 500 mg 3 times daily for his diabetes mellitus. He also takes over-the-counter vitamin D 2000 units daily as well as fish oil 2400 mg.  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  He tells me that he no longer is required to have yearly DOT required nuclear stress test, but this needs to be done now every other year.  Presently, he denies chest pain.  He denies PND or orthopnea.  He denies  presyncope or syncope.  Past Medical History  Diagnosis Date  . Coronary artery disease      Past Surgical History  Procedure Laterality Date  . Cardiac catheterization  08/03/2008    Cutting balloon atherotomy and stenting of LAD and mid LAD. Stented with a 2.5x64m Xience and posrdilated at 2.78. The 80% lesion was reduced to 0%.  . Cardiac catheterization  05/06/2001    Stenting of 99% mid RCA lesion performed with a 3.5x169mZeta stent at 13 atm resulting in reduction of 99% stenosis to 0% with TIMI2-3 flow.  . Cardiovascular stress test  09/30/2011    No significant reversible perfusion defects. No ECG changes, non-diagnostic for ischemia.  . Transthoracic echocardiogram  11/25/2004    EF 50-55%, mild mitral valve regurg, mild tricuspid valve regurg, LA-mildy dilated    Allergies  Allergen Reactions  . Codeine Nausea Only    Current Outpatient Prescriptions  Medication Sig Dispense Refill  . cholecalciferol (VITAMIN D) 1000 UNITS tablet Take 1,000 Units by mouth daily.    . clopidogrel (PLAVIX) 75 MG tablet Take 1 tablet (75 mg total) by mouth daily. 90 tablet 3  . escitalopram (LEXAPRO) 20 MG tablet Take 20 mg by mouth daily.    . isosorbide mononitrate (IMDUR) 60 MG 24 hr tablet Take 1 tablet (60 mg total) by mouth daily. 90 tablet 3  . metFORMIN (GLUCOPHAGE) 500 MG tablet Take  1 tablet by mouth 3 (three) times daily.    . metoprolol succinate (TOPROL-XL) 50 MG 24 hr tablet Take 1/2 tablet (46m total)  Alternating with 1 tablet every other day 45 tablet 3  . rosuvastatin (CRESTOR) 20 MG tablet Take 1 tablet (20 mg total) by mouth daily. 90 tablet 3   No current facility-administered medications for this visit.    Social History   Social History  . Marital Status: Married    Spouse Name: N/A  . Number of Children: N/A  . Years of Education: N/A   Occupational History  . Not on file.   Social History Main Topics  . Smoking status: Former Smoker -- 2.00 packs/day  for 2 years    Types: Cigarettes  . Smokeless tobacco: Never Used     Comment: quit smoking in 1987  . Alcohol Use: No     Comment: quit drinking in1990.  . Drug Use: Not on file  . Sexual Activity: Not on file   Other Topics Concern  . Not on file   Social History Narrative   Social history are his 3 children 8 grandchildren 2 great-grandchildren. He does not routinely exercise. He spends many hours a day and a truck. He denies any significant awareness of weight change.  Family History  Problem Relation Age of Onset  . Heart disease Father    ROS General: Negative; No fevers, chills, or night sweats;  HEENT: Negative; No changes in vision or hearing, sinus congestion, difficulty swallowing Pulmonary: Negative; No cough, wheezing, shortness of breath, hemoptysis Cardiovascular: Negative; No chest pain, presyncope, syncope, palpitations GI: Negative; No nausea, vomiting, diarrhea, or abdominal pain GU: Negative; No dysuria, hematuria, or difficulty voiding Musculoskeletal: Negative; no myalgias, joint pain, or weakness Hematologic/Oncology: Negative; no easy bruising, bleeding Endocrine:  Positive for diabetes Neuro: Negative; no changes in balance, headaches Skin: Negative; No rashes or skin lesions Psychiatric: Negative; No behavioral problems, depression Sleep: Negative; No snoring, daytime sleepiness, hypersomnolence, bruxism, restless legs, hypnogognic hallucinations, no cataplexy Other comprehensive 14 point system review is negative.   BP 136/82 mmHg  Pulse 74  Ht _0  (1.803 m)  Wt 237 lb 3.2 oz (107.593 kg)  BMI 33.10 kg/m2   Repeat blood pressure by me was 138/80.  Wt Readings from Last 3 Encounters:  10/10/15 237 lb 3.2 oz (107.593 kg)  10/13/14 232 lb (105.235 kg)  09/25/14 232 lb 11.2 oz (105.552 kg)   General: Alert, oriented, no distress.  Skin: normal turgor, no rashes HEENT: Normocephalic, atraumatic. Pupils round and reactive; sclera  anicteric;no lid lag. Extraocular muscles intact;; no xanthelasmas. Nose without nasal septal hypertrophy Mouth/Parynx benign; Mallinpatti scale 3 Neck: No JVD, no carotid bruits; normal carotid upstroke Lungs: clear to ausculatation and percussion; no wheezing or rales Chest wall: no tenderness to palpitation Heart: RRR, s1 s2 normal; 1/6 systolic murmurAlong the left sternal border;no diastolic murmur, rub thrills or heaves Abdomen: soft, nontender; no hepatosplenomehaly, BS+; abdominal aorta nontender and not dilated by palpation. Back: no CVA tenderness Pulses 2+ Extremities: no clubbing cyanosis or edema, Homan's sign negative  Neurologic: grossly nonfocal; cranial nerves grossly normal. Psychologic: normal affect and mood.  ECG (independently read by me): Normal sinus rhythm at 74 bpm.  No ectopy.  QTc interval 444 ms.  May 2016 ECG (independently read by me): sinus rhythm at 85 bpm.  February 2015 ECG (independently read by me): Sinus rhythm at 71 beats per minute. No significant ST changes   LABS: BMP Latest  Ref Rng 07/15/2013 08/04/2008  Glucose 70 - 99 mg/dL 125(H) 136(H)  BUN 6 - 23 mg/dL 13 9  Creatinine 0.50 - 1.35 mg/dL 1.08 1.19  Sodium 135 - 145 mEq/L 138 136  Potassium 3.5 - 5.3 mEq/L 4.6 3.8  Chloride 96 - 112 mEq/L 103 106  CO2 19 - 32 mEq/L 28 24  Calcium 8.4 - 10.5 mg/dL 9.6 8.4   Hepatic Function Latest Ref Rng 07/15/2013  Total Protein 6.0 - 8.3 g/dL 7.2  Albumin 3.5 - 5.2 g/dL 4.5  AST 0 - 37 U/L 22  ALT 0 - 53 U/L 19  Alk Phosphatase 39 - 117 U/L 53  Total Bilirubin 0.2 - 1.2 mg/dL 0.5   CBC Latest Ref Rng 07/15/2013 08/04/2008 08/03/2008  WBC 4.0 - 10.5 K/uL 8.8 7.9 6.5  Hemoglobin 13.0 - 17.0 g/dL 13.9 12.8(L) 13.1  Hematocrit 39.0 - 52.0 % 41.4 36.5(L) 38.2(L)  Platelets 150 - 400 K/uL 269 218 214   Lab Results  Component Value Date   TSH 2.324 07/15/2013   Lab Results  Component Value Date   HGBA1C 7.4* 07/15/2013   Lipid Panel       Component Value Date/Time   CHOL 125 07/15/2013 1615   TRIG 149 07/15/2013 1615   HDL 46 07/15/2013 1615   CHOLHDL 2.7 07/15/2013 1615   VLDL 30 07/15/2013 1615   LDLCALC 49 07/15/2013 1615    RADIOLOGY: No results found.    ASSESSMENT AND PLAN: Mr. 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  He is 15 years status post initial stenting to his RCA, and 7 years status post intervention to his LAD ostium and mid LAD.   His blood pressure today is controlled on his current medical regimen.  I again reviewed with him his nuclear stress test from last year.  This continued to show normal perfusion but he did develop mild ST segment depression during stress in the inferior leads and had occasional PVCs.  Resting pulse is 74 bpm.  He has only been taking Toprol at 25 mg daily.  I have suggested that he alternate and take 50 mg alternating with 25 mg every other day.  He continues to be on Crestor for hyperlipidemia and is no longer taking Zetia.  Again discussed resumption of baby aspirin along with his Plavix for dual antiplatelet therapy.  He is not having anginal symptomatology with his beta blocker and isosorbide mononitrate preparation.  He is diabetic.  He continues to take metformin 500 mg 3 times a day.  He is mildly obese with a body mass index of 33.10.  I have recommended increased exercise and weight reduction.  Typically his driving days are at least 12 hours and he often does not have time to exercise on days that he drives.  I will try to obtain results of laboratory that he may have had from his primary care physician.  I will schedule him for his two-year follow-up nuclear stress test next year prior to his DOT deadline and will see him back in the office for follow-up evaluation.   Time spent: 25 minutes Troy Sine, MD, [facility]  10/12/2015 12:33 PM

## 2015-10-24 ENCOUNTER — Other Ambulatory Visit: Payer: Self-pay | Admitting: Cardiovascular Disease

## 2015-11-23 ENCOUNTER — Other Ambulatory Visit: Payer: Self-pay | Admitting: *Deleted

## 2015-11-23 MED ORDER — METOPROLOL SUCCINATE ER 50 MG PO TB24: ORAL_TABLET | ORAL | Status: DC

## 2015-11-26 ENCOUNTER — Other Ambulatory Visit: Payer: Self-pay | Admitting: Cardiovascular Disease

## 2015-11-26 NOTE — Telephone Encounter (Signed)
REFILL 

## 2015-12-10 ENCOUNTER — Ambulatory Visit: Payer: 59 | Admitting: Cardiovascular Disease

## 2016-10-02 ENCOUNTER — Telehealth (HOSPITAL_COMMUNITY): Payer: Self-pay

## 2016-10-02 NOTE — Telephone Encounter (Signed)
Encounter complete. 

## 2016-10-07 ENCOUNTER — Ambulatory Visit (HOSPITAL_COMMUNITY)
Admission: RE | Admit: 2016-10-07 | Discharge: 2016-10-07 | Disposition: A | Discharge status: Home / Self Care | Payer: 59 | Source: Ambulatory Visit | Attending: Cardiology | Admitting: Cardiology

## 2016-10-07 DIAGNOSIS — E785 Hyperlipidemia, unspecified: Secondary | ICD-10-CM | POA: Insufficient documentation

## 2016-10-07 DIAGNOSIS — E669 Obesity, unspecified: Secondary | ICD-10-CM | POA: Diagnosis not present

## 2016-10-07 DIAGNOSIS — E119 Type 2 diabetes mellitus without complications: Secondary | ICD-10-CM | POA: Insufficient documentation

## 2016-10-07 DIAGNOSIS — I2510 Atherosclerotic heart disease of native coronary artery without angina pectoris: Secondary | ICD-10-CM | POA: Diagnosis not present

## 2016-10-07 DIAGNOSIS — Z87891 Personal history of nicotine dependence: Secondary | ICD-10-CM | POA: Insufficient documentation

## 2016-10-07 DIAGNOSIS — Z8249 Family history of ischemic heart disease and other diseases of the circulatory system: Secondary | ICD-10-CM | POA: Diagnosis not present

## 2016-10-07 LAB — MYOCARDIAL PERFUSION IMAGING
CHL CUP NUCLEAR SSS: 2
CHL CUP RESTING HR STRESS: 65 {beats}/min
LV dias vol: 83 mL (ref 62–150)
LVSYSVOL: 32 mL
Peak HR: 80 {beats}/min
SDS: 0
SRS: 2
TID: 1.35

## 2016-10-07 IMAGING — NM NM MISC PROCEDURE
9 series · 54 of 54 positions shown · non-contrast
Comparison: none

[Series 1: wbr rest · 6.40mm/px · 6 of 64 frames shown]
[frame 6/64]
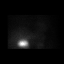
[frame 16/64]
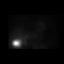
[frame 27/64]
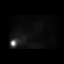
[frame 38/64]
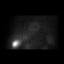
[frame 48/64]
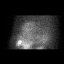
[frame 59/64]
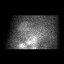

[Series 1: rest sax · 6.4mm · 6.40mm/px · 6 of 23 frames shown]
[frame 2/23]
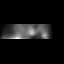
[frame 6/23]
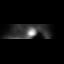
[frame 10/23]
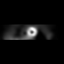
[frame 14/23]
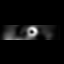
[frame 18/23]
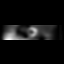
[frame 22/23]
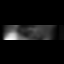

[Series 1: wbr_r-proj_st wbr rest · 6.40mm/px · 6 of 64 frames shown]
[frame 6/64]
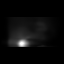
[frame 16/64]
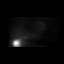
[frame 27/64]
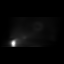
[frame 38/64]
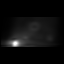
[frame 48/64]
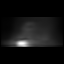
[frame 59/64]
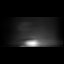

[Series 2: stress sax · 6.4mm · 6.40mm/px · 6 of 23 frames shown]
[frame 2/23]
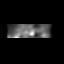
[frame 6/23]
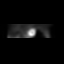
[frame 10/23]
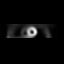
[frame 14/23]
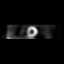
[frame 18/23]
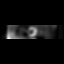
[frame 22/23]
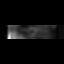

[Series 2: wbr stress-gsp · 6.40mm/px · 6 of 512 frames shown]
[frame 43/512]
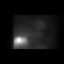
[frame 128/512]
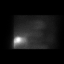
[frame 214/512]
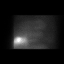
[frame 299/512]
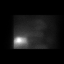
[frame 384/512]
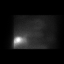
[frame 470/512]
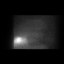

[Series 2: stress sax gs · 6.4mm · 6.40mm/px · 6 of 184 frames shown]
[frame 16/184]
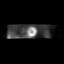
[frame 46/184]
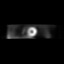
[frame 77/184]
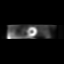
[frame 108/184]
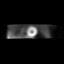
[frame 138/184]
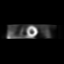
[frame 169/184]
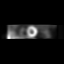

[Series 2: wbr_s-proj_st wbr stress-gsp · 6.40mm/px · 6 of 512 frames shown]
[frame 43/512]
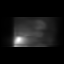
[frame 128/512]
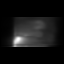
[frame 214/512]
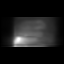
[frame 299/512]
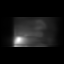
[frame 384/512]
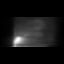
[frame 470/512]
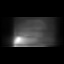

[Series 3: wbr stress-sum-em · 6.40mm/px · 6 of 64 frames shown]
[frame 6/64]
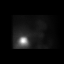
[frame 16/64]
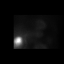
[frame 27/64]
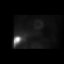
[frame 38/64]
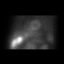
[frame 48/64]
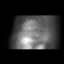
[frame 59/64]
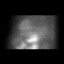

[Series 3: wbr_s-proj_st wbr stress-sum-em · 6.40mm/px · 6 of 64 frames shown]
[frame 6/64]
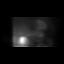
[frame 16/64]
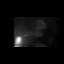
[frame 27/64]
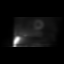
[frame 38/64]
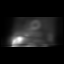
[frame 48/64]
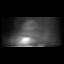
[frame 59/64]
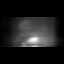

[54 of 54 positions shown; findings below may reference images not displayed]

Canned report from images found in remote index.

Refer to host system for actual result text.

## 2016-10-07 MED ORDER — TECHNETIUM TC 99M TETROFOSMIN IV KIT
31.2000 | PACK | Freq: Once | INTRAVENOUS | Status: AC | PRN
  Administered 2016-10-07: 31.2 via INTRAVENOUS
  Filled 2016-10-07: qty 32

## 2016-10-07 MED ORDER — REGADENOSON 0.4 MG/5ML IV SOLN
0.4000 mg | Freq: Once | INTRAVENOUS | Status: AC
  Administered 2016-10-07: 0.4 mg via INTRAVENOUS

## 2016-10-07 MED ORDER — TECHNETIUM TC 99M TETROFOSMIN IV KIT
10.3000 | PACK | Freq: Once | INTRAVENOUS | Status: AC | PRN
  Administered 2016-10-07: 10.3 via INTRAVENOUS
  Filled 2016-10-07: qty 11

## 2016-10-09 ENCOUNTER — Encounter: Payer: Self-pay | Admitting: Cardiovascular Disease

## 2016-10-09 ENCOUNTER — Ambulatory Visit (INDEPENDENT_AMBULATORY_CARE_PROVIDER_SITE_OTHER): Payer: 59 | Admitting: Cardiovascular Disease

## 2016-10-09 VITALS — BP 137/80 | HR 78 | Ht 71.0 in | Wt 246.0 lb

## 2016-10-09 DIAGNOSIS — E785 Hyperlipidemia, unspecified: Secondary | ICD-10-CM

## 2016-10-09 DIAGNOSIS — I2510 Atherosclerotic heart disease of native coronary artery without angina pectoris: Secondary | ICD-10-CM | POA: Diagnosis not present

## 2016-10-09 DIAGNOSIS — E119 Type 2 diabetes mellitus without complications: Secondary | ICD-10-CM

## 2016-10-09 NOTE — Patient Instructions (Signed)
Your physician wants you to follow-up in: 6 months or sooner if needed. You will receive a reminder letter in the mail two months in advance. If you don't receive a letter, please call our office to schedule the follow-up appointment.   If you need a refill on your cardiac medications before your next appointment, please call your pharmacy. 

## 2016-10-09 NOTE — Progress Notes (Signed)
Patient ID: [name], male   DOB: 1946/04/20, 70 y.o.   MRN: [id number]    Primary M.D.: Dr. Gilford Rile  HPI: [name] is a 71 y.o. male who presents for a 12 month follow-up cardiology evaluation.  [name] has CAD and in 2002 initially underwent stenting to his RCA. In March 2010 he underwent cutting balloon and stenting to his LAD ostium as well as mid LAD and at that time his RCA stent was patent but he did have a 60-70% stenosis beyond the stented segment. 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 His last nuclear perfusion scan was on Oct 13, 2014 which continued to show normal perfusion without scar or ischemia.  He developed mild ST segment changes and had occasional PVCs but scintigraphic images continues to show entirely normal perfusion without evidence for scar or ischemia.  Post stress ejection fraction was 66% and he had normal wall motion  He has a history of diabetes mellitus as well as hyperlipidemia. He has been taking Crestor 20 mg daily and is no longer taking Zetia for his lipids.  Previously he was  taking a full dose 325 aspirin plus Plavix for his dual antiplatelet therapy.  I had recommended reduction of his aspirin to 81 mg.  Apparently he decided to stop his aspirin altogether. He is on isosorbide mononitrate 60 mg and Toprol-XL 25 mg for his cardiac structure disease and he continues to take metformin 500 mg 3 times daily for his diabetes mellitus. He also takes over-the-counter vitamin D 2000 units daily as well as fish oil 2400 mg.  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  Since I last saw him one.  He continues to remain stable.  He denies chest pain or shortness of breath.  He requires every 2 year nuclear stress test per DOT physical.  This was done on 10/07/2016 and showed normal  perfusion without scar or ischemia.  EF was 62%.  There were no ECG changes noted during stress.  Over the past year, he has been maintained on Crestor 20 mg for hyperlipidemia, metoprolol 25 mg and isosorbide 60 mg for his CAD.  Metformin for his diabetes mellitus.  He has history of bleeding and is no longer taking aspirin.  He presents for a year follow-up evaluation.  Past Medical History:  Diagnosis Date  . Coronary artery disease      Past Surgical History:  Procedure Laterality Date  . CARDIAC CATHETERIZATION  08/03/2008   Cutting balloon atherotomy and stenting of LAD and mid LAD. Stented with a 2.5x28m Xience and posrdilated at 2.78. The 80% lesion was reduced to 0%.  . CARDIAC CATHETERIZATION  05/06/2001   Stenting of 99% mid RCA lesion performed with a [id number] stent at 13 atm resulting in reduction of 99% stenosis to 0% with TIMI2-3 flow.  . Marland KitchenARDIOVASCULAR STRESS TEST  09/30/2011   No significant reversible perfusion defects. No ECG changes, non-diagnostic for ischemia.  . TRANSTHORACIC ECHOCARDIOGRAM  11/25/2004   EF 50-55%, mild mitral valve regurg, mild tricuspid valve regurg, LA-mildy dilated    Allergies  Allergen Reactions  . Codeine Nausea Only    Current Outpatient Prescriptions  Medication Sig Dispense Refill  . cholecalciferol (VITAMIN D) 1000 UNITS tablet Take 1,000 Units by mouth daily.    .Marland Kitchen  clopidogrel (PLAVIX) 75 MG tablet TAKE ONE TABLET BY MOUTH ONCE DAILY 90 tablet 3  . escitalopram (LEXAPRO) 20 MG tablet Take 20 mg by mouth daily.    . isosorbide mononitrate (IMDUR) 60 MG 24 hr tablet TAKE 1 TABLET (60 MG TOTAL) BY MOUTH DAILY. 90 tablet 3  . metFORMIN (GLUCOPHAGE) 500 MG tablet Take 1 tablet by mouth 3 (three) times daily.    . metoprolol succinate (TOPROL-XL) 50 MG 24 hr tablet Take 1/2 tablet (46m total)  Alternating with 1 tablet every other day 45 tablet 3  . rosuvastatin (CRESTOR) 20 MG tablet Take 1 tablet (20 mg total) by mouth daily. 90  tablet 3   No current facility-administered medications for this visit.     Social History   Social History  . Marital status: Married    Spouse name: N/A  . Number of children: N/A  . Years of education: N/A   Occupational History  . Not on file.   Social History Main Topics  . Smoking status: Former Smoker    Packs/day: 2.00    Years: 2.00    Types: Cigarettes  . Smokeless tobacco: Never Used     Comment: quit smoking in 1987  . Alcohol use No     Comment: quit drinking in1990.  . Drug use: Unknown  . Sexual activity: Not on file   Other Topics Concern  . Not on file   Social History Narrative  . No narrative on file   Social history are his 3 children 8 grandchildren 2 great-grandchildren. He does not routinely exercise. He spends many hours a day and a truck. He denies any significant awareness of weight change.  Family History  Problem Relation Age of Onset  . Heart disease Father    ROS General: Negative; No fevers, chills, or night sweats;  HEENT: Negative; No changes in vision or hearing, sinus congestion, difficulty swallowing Pulmonary: Negative; No cough, wheezing, shortness of breath, hemoptysis Cardiovascular: Negative; No chest pain, presyncope, syncope, palpitations GI: Negative; No nausea, vomiting, diarrhea, or abdominal pain GU: Negative; No dysuria, hematuria, or difficulty voiding Musculoskeletal: Negative; no myalgias, joint pain, or weakness Hematologic/Oncology: Negative; no easy bruising, bleeding Endocrine:  Positive for diabetes Neuro: Negative; no changes in balance, headaches Skin: Negative; No rashes or skin lesions Psychiatric: Negative; No behavioral problems, depression Sleep: Negative; No snoring, daytime sleepiness, hypersomnolence, bruxism, restless legs, hypnogognic hallucinations, no cataplexy Other comprehensive 14 point system review is negative.     Physical Exam BP 137/80   Pulse 78   Ht '5\' 11"'  (1.803 m)   Wt 246  lb (111.6 kg)   BMI 34.31 kg/m   Repeat blood pressure by me was 132/80 supine and 124/80 standing.  Wt Readings from Last 3 Encounters:  10/09/16 246 lb (111.6 kg)  10/07/16 237 lb (107.5 kg)  10/10/15 237 lb 3.2 oz (107.6 kg)   General: Alert, oriented, no distress.  Skin: normal turgor, no rashes, warm and dry HEENT: Normocephalic, atraumatic. Pupils equal round and reactive to light; sclera anicteric; extraocular muscles intact;  Nose without nasal septal hypertrophy Mouth/Parynx benign; Mallinpatti scal3 Neck: No JVD, no carotid bruits; normal carotid upstroke Lungs: clear to ausculatation and percussion; no wheezing or rales Chest wall: without tenderness to palpitation Heart: PMI not displaced, RRR, s1 s2 normal, 1/6 systolic murmur along the left sternal border, no S3 gallop; no diastolic murmur, no rubs, gallops, thrills, or heaves Abdomen: soft, nontender; no hepatosplenomehaly, BS+; abdominal aorta nontender and not  dilated by palpation. Back: no CVA tenderness Pulses 2+ Musculoskeletal: full range of motion, normal strength, no joint deformities Extremities: no clubbing cyanosis or edema, Homan's sign negative  Neurologic: grossly nonfocal; Cranial nerves grossly wnl Psychologic: Normal mood and affect    ECG (independently read by me): Normal sinus rhythm at 78 bpm.  Normal intervals.  No ST segment changes.  10/10/2015 ECG (independently read by me): Normal sinus rhythm at 74 bpm.  No ectopy.  QTc interval 444 ms.  May 2016 ECG (independently read by me): sinus rhythm at 85 bpm.  February 2015 ECG (independently read by me): Sinus rhythm at 71 beats per minute. No significant ST changes   LABS: BMP Latest Ref Rng & Units 07/15/2013 08/04/2008  Glucose 70 - 99 mg/dL 125(H) 136(H)  BUN 6 - 23 mg/dL 13 9  Creatinine 0.50 - 1.35 mg/dL 1.08 1.19  Sodium 135 - 145 mEq/L 138 136  Potassium 3.5 - 5.3 mEq/L 4.6 3.8  Chloride 96 - 112 mEq/L 103 106  CO2 19 - 32 mEq/L 28  24  Calcium 8.4 - 10.5 mg/dL 9.6 8.4   Hepatic Function Latest Ref Rng & Units 07/15/2013  Total Protein 6.0 - 8.3 g/dL 7.2  Albumin 3.5 - 5.2 g/dL 4.5  AST 0 - 37 U/L 22  ALT 0 - 53 U/L 19  Alk Phosphatase 39 - 117 U/L 53  Total Bilirubin 0.2 - 1.2 mg/dL 0.5   CBC Latest Ref Rng & Units 07/15/2013 08/04/2008 08/03/2008  WBC 4.0 - 10.5 K/uL 8.8 7.9 6.5  Hemoglobin 13.0 - 17.0 g/dL 13.9 12.8(L) 13.1  Hematocrit 39.0 - 52.0 % 41.4 36.5(L) 38.2(L)  Platelets 150 - 400 K/uL 269 218 214   Lab Results  Component Value Date   TSH 2.324 07/15/2013   Lab Results  Component Value Date   HGBA1C 7.4 (H) 07/15/2013   Lipid Panel     Component Value Date/Time   CHOL 125 07/15/2013 1615   TRIG 149 07/15/2013 1615   HDL 46 07/15/2013 1615   CHOLHDL 2.7 07/15/2013 1615   VLDL 30 07/15/2013 1615   LDLCALC 49 07/15/2013 1615    RADIOLOGY: No results found.  IMPRESSION: 1. Coronary artery disease involving native coronary artery of native heart without angina pectoris   2. Hyperlipidemia with target LDL less than 70   3. 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  He is 16 years status post initial stenting to his RCA in 2002, and 8 years status post intervention to his LAD ostium and mid LAD in 2010.   His blood pressure today is controlled on his current medical regimen.  Recently underwent a 2 year follow-up nuclear perfusion study per DOT requirement.  This continued to show normal perfusion.  There were no ECG changes with stress.  EF was 62%.  There was no scar or ischemia.  Over the past year, he has continued to remain stable.  His blood pressure is controlled today on Toprol-XL 25 mg and isosorbide 60 mg.  He is not having anginal symptoms.  He is on Crestor 20 mg hyperlipidemia with target LDL less than 70.  He has type 2  diabetes mellitus on metformin.  He continues to be on Plavix but is no longer taking aspirin due to history of prior bleeding.  He  is stable from a cardiovascular standpoint.  I'm giving him clearance having for a cardiac perspective.  I will see him in one year for reevaluation or sooner if problem arise.  Time spent: 25 minutes Troy Sine, MD, [facility], [place]  10/11/2016 9:28 PM

## 2017-07-31 ENCOUNTER — Telehealth: Payer: Self-pay

## 2017-07-31 NOTE — Telephone Encounter (Signed)
   [facility] Pre-operative Risk Assessment    Request for surgical clearance:  1. What type of surgery is being performed? COLONOSCOPY   2. When is this surgery scheduled? 08-24-17   3. What type of clearance is required (medical clearance vs. Pharmacy clearance to hold med vs. Both)? PHARMACY  4. Are there any medications that need to be held prior to surgery and how long?PLAVIX   5. Practice name and name of physician performing surgery? [facility]   6. What is your office phone and fax number? [phone]   7. Anesthesia type (None, local, MAC, general) ? NOT LISTED   [name] 07/31/2017, 10:40 AM  _________________________________________________________________   (provider comments below)

## 2017-08-03 NOTE — Telephone Encounter (Signed)
   Primary Cardiologist: Nicki Guadalajarahomas Kelly, MD  Chart reviewed as part of pre-operative protocol coverage. Patient was contacted 08/03/2017 in reference to pre-operative risk assessment for pending surgery as outlined below.  [name] was last seen on 10/09/2016 by Dr. Tresa EndoKelly.  Since that day, [name] has done well denies any CP or SOB.  Therefore, based on ACC/AHA guidelines, the patient would be at acceptable risk for the planned procedure without further cardiovascular testing.   I will forward this note to Dr. Tresa EndoKelly for final clearance regarding his ability to hold plavix for 5 days prior to colonoscopy. Dr. Tresa EndoKelly, please send your response to [facility] Preop  Azalee CourseHao Stiven Kaspar, PA 08/03/2017, 5:28 PM

## 2017-08-03 NOTE — Telephone Encounter (Signed)
   Primary Cardiologist:Thomas Tresa EndoKelly, MD  Chart reviewed as part of pre-operative protocol coverage. Because of Franklin PilgrimJames R Mcneil's past medical history and time since last visit, he/she will require a follow-up visit in order to better assess preoperative cardiovascular risk.  Pre-op covering staff: - Please schedule appointment and call patient to inform them. - Please contact requesting surgeon's office via preferred method (i.e, phone, fax) to inform them of need for appointment prior to surgery.  Joni ReiningKathryn Fenris Cauble DNP, ANP, AACC  08/03/2017, 5:29 PM

## 2017-08-04 NOTE — Telephone Encounter (Addendum)
Spoke to VaughnHao Meng, [place] to clarify clearance.   Per PA, patient does NOT need appt.  Patient is aware and note will be corrected.    Forwarded to Hartford FinancialHao Meng PA   Once note is corrected, will need to fax to requesting office.

## 2017-08-06 NOTE — Telephone Encounter (Signed)
Okay to hold Plavix for minimum of 5 days prior to the procedure; .  Agree, no need for preprocedure office evaluation

## 2017-08-06 NOTE — Telephone Encounter (Signed)
This patient does not need to have an office visit prior to his colonoscopy.   I will route this to Dr. Tresa EndoKelly for input regarding holding Plavix for the procedure.   Dr. Tresa EndoKelly please route your response back to the preop pool.

## 2017-08-07 NOTE — Telephone Encounter (Signed)
   Primary Cardiologist: Nicki Guadalajarahomas Kelly, MD  Chart reviewed as part of pre-operative protocol coverage.  Patient has been previously cleared to proceed with planned colonoscopy.  Dr. Tresa EndoKelly agrees that Plavix can be held for 5 day prior to procedure.  I will route this recommendation to the requesting party via Epic fax function and remove from pre-op pool.  Callback pool:  Please call requesting surgeon to ensure fax received.  Tereso NewcomerScott Weaver, PA-C 08/07/2017, 8:33 AM

## 2017-08-07 NOTE — Telephone Encounter (Signed)
I called Dr. Marlane HatcherMagod's office [facility] to confirm clearance was received through Epic. Operator states it will take a while to come through. I advised to please call the office if they did not receive it and we will be happy to fax over a hard copy.

## 2017-08-13 ENCOUNTER — Ambulatory Visit: Payer: 59 | Admitting: Cardiovascular Disease

## 2018-07-21 ENCOUNTER — Ambulatory Visit (INDEPENDENT_AMBULATORY_CARE_PROVIDER_SITE_OTHER): Payer: 59 | Admitting: Cardiovascular Disease

## 2018-07-21 ENCOUNTER — Encounter: Payer: Self-pay | Admitting: Cardiovascular Disease

## 2018-07-21 VITALS — BP 120/70 | HR 80 | Ht 72.0 in | Wt 235.8 lb

## 2018-07-21 DIAGNOSIS — E119 Type 2 diabetes mellitus without complications: Secondary | ICD-10-CM

## 2018-07-21 DIAGNOSIS — I493 Ventricular premature depolarization: Secondary | ICD-10-CM

## 2018-07-21 DIAGNOSIS — I2510 Atherosclerotic heart disease of native coronary artery without angina pectoris: Secondary | ICD-10-CM

## 2018-07-21 DIAGNOSIS — E785 Hyperlipidemia, unspecified: Secondary | ICD-10-CM

## 2018-07-21 MED ORDER — METOPROLOL SUCCINATE ER 50 MG PO TB24: 50.0000 mg | ORAL_TABLET | Freq: Every day | ORAL | 3 refills | Status: AC

## 2018-07-21 NOTE — Progress Notes (Signed)
Patient ID: JAXSIN BOTTOMLEY, male   DOB: 1945-10-14, 73 y.o.   MRN: [id number]    Primary M.D.: Dr. Gilford Rile  HPI: ABDULAZIZ TOMAN is a 73 y.o. male who presents for a 22 month follow-up cardiology evaluation.  Mr. Strahm has CAD and in 2002 initially underwent stenting to his RCA. In March 2010 he underwent cutting balloon and stenting to his LAD ostium as well as mid LAD and at that time his RCA stent was patent but he did have a 60-70% stenosis beyond the stented segment. 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 His last nuclear perfusion scan was on Oct 13, 2014 which continued to show normal perfusion without scar or ischemia.  He developed mild ST segment changes and had occasional PVCs but scintigraphic images continues to show entirely normal perfusion without evidence for scar or ischemia.  Post stress ejection fraction was 66% and he had normal wall motion  He has a history of diabetes mellitus as well as hyperlipidemia. He has been taking Crestor 20 mg daily and is no longer taking Zetia for his lipids.  Previously he was  taking a full dose 325 aspirin plus Plavix for his dual antiplatelet therapy.  I had recommended reduction of his aspirin to 81 mg.  Apparently he decided to stop his aspirin altogether. He is on isosorbide mononitrate 60 mg and Toprol-XL 25 mg for his cardiac structure disease and he continues to take metformin 500 mg 3 times daily for his diabetes mellitus. He also takes over-the-counter vitamin D 2000 units daily as well as fish oil 2400 mg.  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  I last saw him in May 2018.  He requires every 2 year nuclear stress test per DOT physical which was done on 10/07/2016 and showed normal perfusion without scar or ischemia.  EF was 62%.  There were no ECG changes noted  during stress.    Since I last saw him, he has remained stable.  He specifically denies any chest pain or shortness of breath.  He continues to work and in May 2020 will again require a 29-year-old follow-up DOT mandated nuclear stress test.  He tells me he will be seeing his primary MD who will be obtaining complete set of laboratory.  He continues to be on rosuvastatin 20 mg for hyperlipidemia.  He is on Toprol-XL 25 alternating with 50 mg and isosorbide 60 mg for CAD.  He is on clopidogrel.  He continues to be on Lexapro for depression.  He is diabetic on metformin.  He presents for follow-up evaluation  Past Medical History:  Diagnosis Date  . Coronary artery disease      Past Surgical History:  Procedure Laterality Date  . CARDIAC CATHETERIZATION  08/03/2008   Cutting balloon atherotomy and stenting of LAD and mid LAD. Stented with a 2.5x58m Xience and posrdilated at 2.78. The 80% lesion was reduced to 0%.  . CARDIAC CATHETERIZATION  05/06/2001   Stenting of 99% mid RCA lesion performed with a 3.5x158mZeta stent at 13 atm resulting in reduction of 99% stenosis to 0% with TIMI2-3 flow.  . Marland KitchenARDIOVASCULAR STRESS TEST  09/30/2011   No significant reversible perfusion defects. No ECG changes, non-diagnostic for ischemia.  . TRANSTHORACIC ECHOCARDIOGRAM  11/25/2004   EF 50-55%, mild  mitral valve regurg, mild tricuspid valve regurg, LA-mildy dilated    Allergies  Allergen Reactions  . Codeine Nausea Only    Current Outpatient Medications  Medication Sig Dispense Refill  . cholecalciferol (VITAMIN D) 1000 UNITS tablet Take 1,000 Units by mouth daily.    . clopidogrel (PLAVIX) 75 MG tablet TAKE ONE TABLET BY MOUTH ONCE DAILY 90 tablet 3  . escitalopram (LEXAPRO) 20 MG tablet Take 20 mg by mouth daily.    . isosorbide mononitrate (IMDUR) 60 MG 24 hr tablet TAKE 1 TABLET (60 MG TOTAL) BY MOUTH DAILY. 90 tablet 3  . metFORMIN (GLUCOPHAGE) 500 MG tablet Take 1 tablet by mouth 3 (three) times  daily.    . metoprolol succinate (TOPROL-XL) 50 MG 24 hr tablet Take 1 tablet (50 mg total) by mouth daily. 45 tablet 3  . rosuvastatin (CRESTOR) 20 MG tablet Take 1 tablet (20 mg total) by mouth daily. 90 tablet 3   No current facility-administered medications for this visit.     Social History   Socioeconomic History  . Marital status: Married    Spouse name: Not on file  . Number of children: Not on file  . Years of education: Not on file  . Highest education level: Not on file  Occupational History  . Not on file  Social Needs  . Financial resource strain: Not on file  . Food insecurity:    Worry: Not on file    Inability: Not on file  . Transportation needs:    Medical: Not on file    Non-medical: Not on file  Tobacco Use  . Smoking status: Former Smoker    Packs/day: 2.00    Years: 2.00    Pack years: 4.00    Types: Cigarettes  . Smokeless tobacco: Never Used  . Tobacco comment: quit smoking in 1987  Substance and Sexual Activity  . Alcohol use: No    Comment: quit drinking in1990.  . Drug use: Not on file  . Sexual activity: Not on file  Lifestyle  . Physical activity:    Days per week: Not on file    Minutes per session: Not on file  . Stress: Not on file  Relationships  . Social connections:    Talks on phone: Not on file    Gets together: Not on file    Attends religious service: Not on file    Active member of club or organization: Not on file    Attends meetings of clubs or organizations: Not on file    Relationship status: Not on file  . Intimate partner violence:    Fear of current or ex partner: Not on file    Emotionally abused: Not on file    Physically abused: Not on file    Forced sexual activity: Not on file  Other Topics Concern  . Not on file  Social History Narrative  . Not on file   Social history are his 3 children 8 grandchildren 2 great-grandchildren. He does not routinely exercise. He spends many hours a day and a truck. He  denies any significant awareness of weight change.  Family History  Problem Relation Age of Onset  . Heart disease Father    ROS General: Negative; No fevers, chills, or night sweats;  HEENT: Negative; No changes in vision or hearing, sinus congestion, difficulty swallowing Pulmonary: Negative; No cough, wheezing, shortness of breath, hemoptysis Cardiovascular: Negative; No chest pain, presyncope, syncope, palpitations GI: Negative; No nausea, vomiting, diarrhea, or  abdominal pain GU: Negative; No dysuria, hematuria, or difficulty voiding Musculoskeletal: Negative; no myalgias, joint pain, or weakness Hematologic/Oncology: Negative; no easy bruising, bleeding Endocrine:  Positive for diabetes Neuro: Negative; no changes in balance, headaches Skin: Negative; No rashes or skin lesions Psychiatric: Negative; No behavioral problems, depression Sleep: Negative; No snoring, daytime sleepiness, hypersomnolence, bruxism, restless legs, hypnogognic hallucinations, no cataplexy Other comprehensive 14 point system review is negative.  Physical Exam BP 120/70   Pulse 80   Ht 6' (1.829 m)   Wt 235 lb 12.8 oz (107 kg)   BMI 31.98 kg/m    Repeat blood pressure by me was 126/70  Wt Readings from Last 3 Encounters:  07/21/18 235 lb 12.8 oz (107 kg)  10/09/16 246 lb (111.6 kg)  10/07/16 237 lb (107.5 kg)   General: Alert, oriented, no distress.  Skin: normal turgor, no rashes, warm and dry HEENT: Normocephalic, atraumatic. Pupils equal round and reactive to light; sclera anicteric; extraocular muscles intact;  Nose without nasal septal hypertrophy Mouth/Parynx benign; Mallinpatti scale 3 Neck: No JVD, no carotid bruits; normal carotid upstroke Lungs: clear to ausculatation and percussion; no wheezing or rales Chest wall: without tenderness to palpitation Heart: PMI not displaced, RRR, s1 s2 normal, 1/6 systolic murmur, no diastolic murmur, no rubs, gallops, thrills, or heaves Abdomen:  soft, nontender; no hepatosplenomehaly, BS+; abdominal aorta nontender and not dilated by palpation. Back: no CVA tenderness Pulses 2+ Musculoskeletal: full range of motion, normal strength, no joint deformities Extremities: no clubbing cyanosis or edema, Homan's sign negative  Neurologic: grossly nonfocal; Cranial nerves grossly wnl Psychologic: Normal mood and affect   ECG (independently read by me): Normal sinus rhythm with frequent PVCs with transient bigeminy.  QTc interval 442 ms.  PR 160 ms.  May 2018 ECG (independently read by me): Normal sinus rhythm at 78 bpm.  Normal intervals.  No ST segment changes.  10/10/2015 ECG (independently read by me): Normal sinus rhythm at 74 bpm.  No ectopy.  QTc interval 444 ms.  May 2016 ECG (independently read by me): sinus rhythm at 85 bpm.  February 2015 ECG (independently read by me): Sinus rhythm at 71 beats per minute. No significant ST changes   LABS: BMP Latest Ref Rng & Units 07/15/2013 08/04/2008  Glucose 70 - 99 mg/dL 125(H) 136(H)  BUN 6 - 23 mg/dL 13 9  Creatinine 0.50 - 1.35 mg/dL 1.08 1.19  Sodium 135 - 145 mEq/L 138 136  Potassium 3.5 - 5.3 mEq/L 4.6 3.8  Chloride 96 - 112 mEq/L 103 106  CO2 19 - 32 mEq/L 28 24  Calcium 8.4 - 10.5 mg/dL 9.6 8.4   Hepatic Function Latest Ref Rng & Units 07/15/2013  Total Protein 6.0 - 8.3 g/dL 7.2  Albumin 3.5 - 5.2 g/dL 4.5  AST 0 - 37 U/L 22  ALT 0 - 53 U/L 19  Alk Phosphatase 39 - 117 U/L 53  Total Bilirubin 0.2 - 1.2 mg/dL 0.5   CBC Latest Ref Rng & Units 07/15/2013 08/04/2008 08/03/2008  WBC 4.0 - 10.5 K/uL 8.8 7.9 6.5  Hemoglobin 13.0 - 17.0 g/dL 13.9 12.8(L) 13.1  Hematocrit 39.0 - 52.0 % 41.4 36.5(L) 38.2(L)  Platelets 150 - 400 K/uL 269 218 214   Lab Results  Component Value Date   TSH 2.324 07/15/2013   Lab Results  Component Value Date   HGBA1C 7.4 (H) 07/15/2013   Lipid Panel     Component Value Date/Time   CHOL 125 07/15/2013 1615  TRIG 149 07/15/2013 1615   HDL  46 07/15/2013 1615   CHOLHDL 2.7 07/15/2013 1615   VLDL 30 07/15/2013 1615   LDLCALC 49 07/15/2013 1615    RADIOLOGY: No results found.  IMPRESSION: 1. Coronary artery disease involving native coronary artery of native heart without angina pectoris   2. Hyperlipidemia with target LDL less than 70   3. Type 2 diabetes mellitus without complication, without long-term current use of insulin ([place])   4. PVC's (premature ventricular contractions)     ASSESSMENT AND PLAN: Mr. 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  He is 18 years status post initial stenting to his RCA in 2002, and 10 years status post intervention to his LAD ostium and mid LAD in 2010.  He continues to drive a truck and as such is required by the Department of Transportation to undergo 2-year follow-up nuclear imaging studies.  His last study in May 2018 showed normal perfusion with an EF of 62%.  On exam today his blood pressure is stable at 126/70.  His ECG however demonstrates occasional PVCs with transient ventricular bigeminy.  I have suggested further titration of his Toprol-XL up to 50 mg daily.  He will monitor his heart rate and blood pressure.  He currently is not having anginal symptoms on his trade beta-blocker regimen.  He continues to be on rosuvastatin for hyperlipidemia with target LDL less than 70.  His primary physician will be checking laboratory and I will asked that these be sent to me for my review.  He is diabetic on metformin.  He denies any history of shortness of breath.  He is euvolemic on exam.  We will schedule him for his nuclear perfusion study in early May and I will see him in follow-up for further evaluation.  Time spent: 25 minutes Troy Sine, MD, [facility]  07/22/2018 3:29 PM

## 2018-07-21 NOTE — Patient Instructions (Signed)
Medication Instructions:  Increase metoprolol to 50 mg daily. If you need a refill on your cardiac medications before your next appointment, please call your pharmacy.    Testing/Procedures: Echocardiogram - Your physician has requested that you have an echocardiogram. Echocardiography is a painless test that uses sound waves to create images of your heart. It provides your doctor with information about the size and shape of your heart and how well your heart's chambers and valves are working. This procedure takes approximately one hour. There are no restrictions for this procedure. This will be performed at our [facility] location - [address], [address].  Your physician has requested that you have an LEXI Myoview. A cardiac stress test is a cardiological test that measures the heart's ability to respond to external stress in a controlled clinical environment. For further information please visit [email or web address] If you have questions or concerns about your appointment, you can call the Nuclear Lab at [phone].       Follow-Up: At [facility], you and your health needs are our priority.  As part of our continuing mission to provide you with exceptional heart care, we have created designated Provider Care Teams.  These Care Teams include your primary Cardiologist (physician) and Advanced Practice Providers (APPs -  Physician Assistants and Nurse Practitioners) who all work together to provide you with the care you need, when you need it. You will need a follow up appointment in 2 months.  Please call our office 2 months in advance to schedule this appointment.  You may see Nicki Guadalajara, MD or one of the following Advanced Practice Providers on your designated Care Team: [place], [place] . Micah Flesher, PA-C

## 2018-07-22 ENCOUNTER — Encounter: Payer: Self-pay | Admitting: Cardiovascular Disease

## 2018-08-16 ENCOUNTER — Other Ambulatory Visit (HOSPITAL_COMMUNITY): Payer: 59

## 2018-09-23 ENCOUNTER — Telehealth (HOSPITAL_COMMUNITY): Payer: Self-pay | Admitting: *Deleted

## 2018-09-23 NOTE — Telephone Encounter (Signed)
Patient given detailed instructions per Myocardial Perfusion Study Information Sheet for the test on 09/28/18 at 11:15. Patient notified to arrive 15 minutes early and that it is imperative to arrive on time for appointment to keep from having the test rescheduled.  If you need to cancel or reschedule your appointment, please call the office within 24 hours of your appointment. . Patient verbalized understanding.Daneil Dolin

## 2018-09-28 ENCOUNTER — Telehealth: Payer: Self-pay | Admitting: Cardiovascular Disease

## 2018-09-28 ENCOUNTER — Ambulatory Visit (HOSPITAL_COMMUNITY)
Admission: RE | Admit: 2018-09-28 | Discharge: 2018-09-28 | Disposition: A | Discharge status: Home / Self Care | Payer: 59 | Source: Ambulatory Visit | Attending: Cardiovascular Disease | Admitting: Cardiovascular Disease

## 2018-09-28 ENCOUNTER — Other Ambulatory Visit: Payer: Self-pay

## 2018-09-28 DIAGNOSIS — I2510 Atherosclerotic heart disease of native coronary artery without angina pectoris: Secondary | ICD-10-CM | POA: Diagnosis present

## 2018-09-28 LAB — MYOCARDIAL PERFUSION IMAGING
LV dias vol: 82 mL (ref 62–150)
LV sys vol: 32 mL
SDS: 0
SRS: 2
SSS: 2
TID: 1.38

## 2018-09-28 MED ORDER — REGADENOSON 0.4 MG/5ML IV SOLN
0.4000 mg | Freq: Once | INTRAVENOUS | Status: AC
  Administered 2018-09-28: 0.4 mg via INTRAVENOUS

## 2018-09-28 MED ORDER — TECHNETIUM TC 99M TETROFOSMIN IV KIT
30.4000 | PACK | Freq: Once | INTRAVENOUS | Status: AC | PRN
  Administered 2018-09-28: 30.4 via INTRAVENOUS
  Filled 2018-09-28: qty 31

## 2018-09-28 MED ORDER — TECHNETIUM TC 99M TETROFOSMIN IV KIT
9.4000 | PACK | Freq: Once | INTRAVENOUS | Status: AC | PRN
  Administered 2018-09-28: 9.4 via INTRAVENOUS
  Filled 2018-09-28: qty 10

## 2018-09-28 NOTE — Telephone Encounter (Signed)
error 

## 2018-10-01 ENCOUNTER — Telehealth: Payer: Self-pay | Admitting: Cardiovascular Disease

## 2018-10-01 NOTE — Telephone Encounter (Signed)
New message:    Patient call concering some results and would like  someone to call.

## 2018-10-04 ENCOUNTER — Telehealth: Payer: 59 | Admitting: Cardiovascular Disease

## 2018-10-04 NOTE — Telephone Encounter (Signed)
Mr Uhrig is calling again for his results He needs them by the end of the week to be able to take his physical.

## 2018-10-04 NOTE — Telephone Encounter (Signed)
Waiting for Dr.Kelly to read results- will let patient know once I get them, will let Dr.Kelly know, and get the results by the end of the week.

## 2018-10-06 NOTE — Telephone Encounter (Signed)
Follow up    Pt is calling back requesting his results He needs a physical for [employer]     Please call

## 2018-10-07 NOTE — Telephone Encounter (Signed)
Called, patient unable to answer- left voicemail. Advising of normal results. Call back with questions.

## 2018-11-08 ENCOUNTER — Other Ambulatory Visit (HOSPITAL_COMMUNITY): Payer: 59

## 2018-11-25 ENCOUNTER — Encounter (HOSPITAL_COMMUNITY): Payer: Self-pay | Admitting: Cardiovascular Disease

## 2018-12-28 ENCOUNTER — Telehealth (HOSPITAL_COMMUNITY): Payer: Self-pay

## 2018-12-28 NOTE — Telephone Encounter (Signed)
New message   Just an FYI. We have made several attempts to contact this patient including sending a letter to schedule or reschedule their echocardiogram. We will be removing the patient from the echo WQ.  7.9.20 mail reminder letter Harvin Konicek  COVID-19--  6.18.20 Cancel Rsn: Patient (COVID 19, will call back when he is ready to reschedule )  6.9.20 @ 10:27am lm on home vm Jocie Meroney

## 2019-04-12 ENCOUNTER — Other Ambulatory Visit (HOSPITAL_COMMUNITY): Payer: 59

## 2019-04-27 ENCOUNTER — Other Ambulatory Visit (HOSPITAL_COMMUNITY): Payer: 59

## 2019-05-04 ENCOUNTER — Telehealth (HOSPITAL_COMMUNITY): Payer: Self-pay

## 2019-05-04 NOTE — Telephone Encounter (Signed)
New message   Just an FYI. We have made several attempts to contact this patient including sending a letter to schedule or reschedule their echocardiogram. We will be removing the patient from the echo WQ.  12.14.20 @ 4:11pm unable to leave a vm - [name]   12.9.20 no show - [name]  7.9.20 mail reminder letter [name]  COVID-19--  6.18.20 Cancel Rsn: Patient (COVID 19, will call back when he is ready to reschedule )  6.9.20 @ 10:27am lm on home vm [name]

## 2019-05-04 NOTE — Telephone Encounter (Signed)
Noted ./cy 

## 2019-05-19 ENCOUNTER — Other Ambulatory Visit: Payer: Self-pay

## 2019-05-19 ENCOUNTER — Ambulatory Visit (HOSPITAL_COMMUNITY): Payer: 59 | Attending: Cardiovascular Disease

## 2019-05-19 DIAGNOSIS — I2510 Atherosclerotic heart disease of native coronary artery without angina pectoris: Secondary | ICD-10-CM | POA: Insufficient documentation

## 2019-05-31 ENCOUNTER — Telehealth: Payer: Self-pay | Admitting: Cardiovascular Disease

## 2019-05-31 NOTE — Telephone Encounter (Signed)
Pt advised his echo results per Dr. Tresa Endo.

## 2019-05-31 NOTE — Telephone Encounter (Signed)
Follow Up:    Pt is returning call from 05-23-19 for his Echo results.

## 2020-08-12 NOTE — Progress Notes (Deleted)
Cardiology Office Note   Date:  08/12/2020   ID:  VRISHANK Mcneil, DOB [birth year], MRN [id number]  PCP:  Raina Mina., MD  Cardiologist:  Shelva Majestic, MD EP: None  No chief complaint on file.     History of Present Illness: [name] is a 75 y.o. male with a PMH of CAD s/p PCI to RCA in 2002 and LAD in 2010, HTN, HLD, DM type 2, who presents for routine follow-up.  He was last evaluated by cardiology at an outpatient visit with Dr. Claiborne Billings 07/2018 at which time he was doing well from a cardiac standpoint. 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 He was noted to have PVC's/bigeminy on EKG that visit and recommended to increase his metoprolol succinate to 52m daily. He underwent a NST 09/27/20 which showed EF 61%, and a small fixed defect in the apical septal region suggestive of artifact, though no reversible ischemia. Echo 04/2019 showed EF 60-65%, mild LVH, elevated LVEDP, G1DD, no RWMA, and no significant valvular abnormalities.   He presents today for follow-up.   1. CAD s/p PCI to RCA in 2002 and LAD in 2010: no anginal complaints. Due for updated NST for DOT physical to continue long distance truck driving.  - Will update a NST for DOT compliance - Continue plavix and statin - Continue BBlocker and imdur  2. HTN: BP *** today - Continue metoprolol succinate  3. HLD: LDL 86, triglycerides 166 12/2019; goal LDL <70 - Continue crestor vs increase crestor ***  4. PVCs/bigeminy: noted on EKG last visit prompting increase in metoprolol. No evidence on EKG today and largely asymptomatic*** - Continue BBlocker  5. DM type 2: A1C 7.7 12/2019; goal <7 - Continue metformin  Past Medical History:  Diagnosis Date  . Coronary artery disease     Past Surgical History:  Procedure Laterality Date  . CARDIAC CATHETERIZATION  08/03/2008   Cutting balloon atherotomy and stenting of LAD and mid LAD. Stented with a 2.5x132mXience and  posrdilated at 2.78. The 80% lesion was reduced to 0%.  . CARDIAC CATHETERIZATION  05/06/2001   Stenting of 99% mid RCA lesion performed with a 3.5x1354meta stent at 13 atm resulting in reduction of 99% stenosis to 0% with TIMI2-3 flow.  . CMarland KitchenRDIOVASCULAR STRESS TEST  09/30/2011   No significant reversible perfusion defects. No ECG changes, non-diagnostic for ischemia.  . TRANSTHORACIC ECHOCARDIOGRAM  11/25/2004   EF 50-55%, mild mitral valve regurg, mild tricuspid valve regurg, LA-mildy dilated     Current Outpatient Medications  Medication Sig Dispense Refill  . cholecalciferol (VITAMIN D) 1000 UNITS tablet Take 1,000 Units by mouth daily.    . clopidogrel (PLAVIX) 75 MG tablet TAKE ONE TABLET BY MOUTH ONCE DAILY 90 tablet 3  . escitalopram (LEXAPRO) 20 MG tablet Take 20 mg by mouth daily.    . isosorbide mononitrate (IMDUR) 60 MG 24 hr tablet TAKE 1 TABLET (60 MG TOTAL) BY MOUTH DAILY. 90 tablet 3  . metFORMIN (GLUCOPHAGE) 500 MG tablet Take 1 tablet by mouth 3 (three) times daily.    . metoprolol succinate (TOPROL-XL) 50 MG 24 hr tablet Take 1 tablet (50 mg total) by mouth daily. 45 tablet 3  . rosuvastatin (CRESTOR) 20 MG tablet Take 1 tablet (20 mg total) by mouth daily. 90 tablet 3   No current facility-administered medications for this visit.    Allergies:   Codeine  Social History:  The patient  reports that he has quit smoking. His smoking use included cigarettes. He has a 4.00 pack-year smoking history. He has never used smokeless tobacco. He reports that he does not drink alcohol.   Family History:  The patient's ***family history includes Heart disease in his father.    ROS:  Please see the history of present illness.   Otherwise, review of systems are positive for {NONE DEFAULTED:18576::"none"}.   All other systems are reviewed and negative.    PHYSICAL EXAM: VS:  There were no vitals taken for this visit. , BMI There is no height or weight on file to calculate  BMI. GEN: Well nourished, well developed, in no acute distress HEENT: normal Neck: no JVD, carotid bruits, or masses Cardiac: ***RRR; no murmurs, rubs, or gallops,no edema  Respiratory:  clear to auscultation bilaterally, normal work of breathing GI: soft, nontender, nondistended, + BS MS: no deformity or atrophy Skin: warm and dry, no rash Neuro:  Strength and sensation are intact Psych: euthymic mood, full affect   EKG:  EKG {ACTION; IS/IS TML:46503546} ordered today. The ekg ordered today demonstrates ***   Recent Labs: No results found for requested labs within last 8760 hours.    Lipid Panel    Component Value Date/Time   CHOL 125 07/15/2013 1615   TRIG 149 07/15/2013 1615   HDL 46 07/15/2013 1615   CHOLHDL 2.7 07/15/2013 1615   VLDL 30 07/15/2013 1615   LDLCALC 49 07/15/2013 1615      Wt Readings from Last 3 Encounters:  09/28/18 235 lb (106.6 kg)  07/21/18 235 lb 12.8 oz (107 kg)  10/09/16 246 lb (111.6 kg)      Other studies Reviewed: Additional studies/ records that were reviewed today include:   Echocardiogram 04/2019: 1. Left ventricular ejection fraction, by visual estimation, is 60 to  65%. The left ventricle has normal function. Left ventricular septal wall  thickness was mildly increased. Mildly increased left ventricular  posterior wall thickness. There is mildly  increased left ventricular hypertrophy.  2. Elevated left ventricular end-diastolic pressure.  3. Left ventricular diastolic parameters are consistent with Grade I  diastolic dysfunction (impaired relaxation).  4. The left ventricle has no regional wall motion abnormalities.  5. Global right ventricle has normal systolic function.The right  ventricular size is normal. No increase in right ventricular wall  thickness.  6. Left atrial size was normal.  7. Right atrial size was normal.  8. Mild thickening of the anterior mitral valve leaflet(s).  9. Moderate mitral annular  calcification.  10. The mitral valve is normal in structure. No evidence of mitral valve  regurgitation. No evidence of mitral stenosis.  11. The tricuspid valve is normal in structure.  12. The aortic valve is tricuspid. Aortic valve regurgitation is not  visualized. No evidence of aortic valve sclerosis or stenosis.  13. The pulmonic valve was normal in structure. Pulmonic valve  regurgitation is not visualized.  14. Normal pulmonary artery systolic pressure.  15. The inferior vena cava is normal in size with greater than 50%  respiratory variability, suggesting right atrial pressure of 3 mmHg.   NST 09/2018:  Nuclear stress EF: 61%.  The left ventricular ejection fraction is normal (55-65%).  No T wave inversion was noted during stress.  There was no ST segment deviation noted during stress.  Defect 1: There is a small defect of mild severity.  This is a low risk study.   Small size, mild severity  fixed apical septal perfusion defect, suggestive of artifact or less likely scar. No reversible ischemia. LVEF 61% with normal wall motion. This is a low risk study. No change compared to prior study in 2018.    ASSESSMENT AND PLAN:  1.  ***   Current medicines are reviewed at length with the patient today.  The patient {ACTIONS; HAS/DOES NOT HAVE:19233} concerns regarding medicines.  The following changes have been made:  {PLAN; NO CHANGE:13088:s}  Labs/ tests ordered today include: *** No orders of the defined types were placed in this encounter.    Disposition:   FU with *** in {gen number 5-39:122583} {Days to years:10300}  Signed, Abigail Butts, PA-C  08/12/2020 7:04 AM

## 2020-08-13 ENCOUNTER — Ambulatory Visit: Payer: 59 | Admitting: Medical

## 2020-08-13 DIAGNOSIS — I10 Essential (primary) hypertension: Secondary | ICD-10-CM

## 2020-08-13 DIAGNOSIS — I493 Ventricular premature depolarization: Secondary | ICD-10-CM

## 2020-08-13 DIAGNOSIS — I2510 Atherosclerotic heart disease of native coronary artery without angina pectoris: Secondary | ICD-10-CM

## 2020-08-13 DIAGNOSIS — E119 Type 2 diabetes mellitus without complications: Secondary | ICD-10-CM

## 2020-08-13 DIAGNOSIS — E785 Hyperlipidemia, unspecified: Secondary | ICD-10-CM

## 2020-09-13 NOTE — Progress Notes (Signed)
Cardiology Office Note   Date:  09/14/2020   ID:  [name], DOB 1945-08-01, MRN [id number]  PCP:  Raina Mina., MD  Cardiologist: Dr. Claiborne Billings CC: Follow Up    History of Present Illness: [name] is a 75 y.o. male who presents for ongoing assessment and management of coronary artery disease status post stenting to the right coronary artery in 2002, and LAD ostium and mid LAD in 2010.  He is a Programmer, systems.  Most recent nuclear medicine study in May 2018 revealed normal perfusion with an EF of 62%.  EKG demonstrated occasional PVCs with transient ventricular bigeminy.    When last seen by Dr. Claiborne Billings on 07/21/2018 he was increased on his dose of metoprolol to Toprol XL 50 mg daily.  He was without symptoms.  He was continued on high-dose statin therapy for hyperlipidemia.   He comes today in need of a DOT nuclear medicine stress test and cardiology evaluation.  He denies any recurrent chest pain he does have some mild shortness of breath if he "sits around the house to much" or is an active.  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  He states that he will need labs as well and his missed appointments with his primary care physician concerning annual labs and follow-up.  He denies any cardiac symptoms at this time.  Past Medical History:  Diagnosis Date  . Coronary artery disease     Past Surgical History:  Procedure Laterality Date  . CARDIAC CATHETERIZATION  08/03/2008   Cutting balloon atherotomy and stenting of LAD and mid LAD. Stented with a 2.5x41m Xience and posrdilated at 2.78. The 80% lesion was reduced to 0%.  . CARDIAC CATHETERIZATION  05/06/2001   Stenting of 99% mid RCA lesion performed with a 3.5x15mZeta stent at 13 atm resulting in reduction of 99% stenosis to 0% with TIMI2-3 flow.  . Marland KitchenARDIOVASCULAR STRESS TEST  09/30/2011   No significant reversible perfusion defects. No ECG changes,  non-diagnostic for ischemia.  . TRANSTHORACIC ECHOCARDIOGRAM  11/25/2004   EF 50-55%, mild mitral valve regurg, mild tricuspid valve regurg, LA-mildy dilated     Current Outpatient Medications  Medication Sig Dispense Refill  . clopidogrel (PLAVIX) 75 MG tablet TAKE ONE TABLET BY MOUTH ONCE DAILY 90 tablet 3  . escitalopram (LEXAPRO) 20 MG tablet Take 20 mg by mouth daily.    . isosorbide mononitrate (IMDUR) 60 MG 24 hr tablet TAKE 1 TABLET (60 MG TOTAL) BY MOUTH DAILY. 90 tablet 3  . metFORMIN (GLUCOPHAGE) 500 MG tablet Take 1 tablet by mouth 3 (three) times daily.    . metoprolol succinate (TOPROL-XL) 50 MG 24 hr tablet Take 1 tablet (50 mg total) by mouth daily. 45 tablet 3  . rosuvastatin (CRESTOR) 20 MG tablet Take 1 tablet (20 mg total) by mouth daily. 90 tablet 3   No current facility-administered medications for this visit.    Allergies:   Codeine    Social History:  The patient  reports that he has quit smoking. His smoking use included cigarettes. He has a 4.00 pack-year smoking history. He has never used smokeless tobacco. He reports that he does not drink alcohol.   Family History:  The patient's family history includes Heart disease in his father.    ROS: All other systems are reviewed and negative. Unless otherwise mentioned in H&P    PHYSICAL EXAM:  VS:  BP 116/72   Pulse 85   Ht 6' (1.829 m)   Wt 235 lb 9.6 oz (106.9 kg)   SpO2 98%   BMI 31.95 kg/m  , BMI Body mass index is 31.95 kg/m. GEN: Well nourished, well developed, in no acute distress HEENT: normal Neck: no JVD, carotid bruits, or masses Cardiac: RRR; no murmurs, rubs, or gallops,no edema  Respiratory:  Clear to auscultation bilaterally, normal work of breathing GI: soft, nontender, nondistended, + BS MS: no deformity or atrophy Skin: warm and dry, no rash Neuro:  Strength and sensation are intact Psych: euthymic mood, full affect   EKG: (Personally reviewed) normal sinus rhythm with nonspecific  T wave abnormality Motion artifact is noted.  Heart rate of 85 bpm  Recent Labs: No results found for requested labs within last 8760 hours.    Lipid Panel    Component Value Date/Time   CHOL 125 07/15/2013 1615   TRIG 149 07/15/2013 1615   HDL 46 07/15/2013 1615   CHOLHDL 2.7 07/15/2013 1615   VLDL 30 07/15/2013 1615   LDLCALC 49 07/15/2013 1615      Wt Readings from Last 3 Encounters:  09/14/20 235 lb 9.6 oz (106.9 kg)  09/28/18 235 lb (106.6 kg)  07/21/18 235 lb 12.8 oz (107 kg)      Other studies Reviewed: Echocardiogram 2019/05/22 1. Left ventricular ejection fraction, by visual estimation, is 60 to  65%. The left ventricle has normal function. Left ventricular septal wall  thickness was mildly increased. Mildly increased left ventricular  posterior wall thickness. There is mildly  increased left ventricular hypertrophy.  2. Elevated left ventricular end-diastolic pressure.  3. Left ventricular diastolic parameters are consistent with Grade I  diastolic dysfunction (impaired relaxation).  4. The left ventricle has no regional wall motion abnormalities.  5. Global right ventricle has normal systolic function.The right  ventricular size is normal. No increase in right ventricular wall  thickness.  6. Left atrial size was normal.  7. Right atrial size was normal.  8. Mild thickening of the anterior mitral valve leaflet(s).  9. Moderate mitral annular calcification.  10. The mitral valve is normal in structure. No evidence of mitral valve  regurgitation. No evidence of mitral stenosis.  11. The tricuspid valve is normal in structure.  12. The aortic valve is tricuspid. Aortic valve regurgitation is not  visualized. No evidence of aortic valve sclerosis or stenosis.  13. The pulmonic valve was normal in structure. Pulmonic valve  regurgitation is not visualized.  14. Normal pulmonary artery systolic pressure.  15. The inferior vena cava is normal in size  with greater than 50%  respiratory variability, suggesting right atrial pressure of 3 mmHg.   ASSESSMENT AND PLAN:  1.  Coronary artery disease: He is due for nuclear medicine stress test for DOT physical and clearance.  We will schedule this to hopefully be available on follow-up appointment with Dr. Claiborne Billings in the next few weeks.  I will not make any changes in his medications.  He will need to hold metoprolol prior to stress test.  2.  Hypertension: Continue current medication regimen.  We will have follow-up BMET drawn for evaluation of kidney function  3.  Hyperlipidemia: Target of LDL less than 70.  Fasting lipids and LFTs will be drawn the morning of his stress test.  4.  Type 2 diabetes: Followed by PCP.  Will have hemoglobin A1c drawn with labs prior to stress test for documentation purposes for DOT  as well.  All of this will be discussed on follow-up with Dr. Claiborne Billings.   Current medicines are reviewed at length with the patient today.  I have spent 25 minutes dedicated to the care of this patient on the date of this encounter to include pre-visit review of records, assessment, management and diagnostic testing,with shared decision making.  Labs/ tests ordered today include: Nuclear medicine stress test, fasting lipids LFTs, CBC, BMET, hemoglobin A1c  Phill Myron. West Pugh, ANP, AACC   09/14/2020 4:56 PM    [facility] [place] [address] Office [phone] Fax [phone]  Notice: This dictation was prepared with Dragon dictation along with smaller phrase technology. Any transcriptional errors that result from this process are unintentional and may not be corrected upon review.

## 2020-09-14 ENCOUNTER — Encounter: Payer: Self-pay | Admitting: Adult Health

## 2020-09-14 ENCOUNTER — Ambulatory Visit (INDEPENDENT_AMBULATORY_CARE_PROVIDER_SITE_OTHER): Payer: 59 | Admitting: Adult Health

## 2020-09-14 ENCOUNTER — Other Ambulatory Visit: Payer: Self-pay

## 2020-09-14 VITALS — BP 116/72 | HR 85 | Ht 72.0 in | Wt 235.6 lb

## 2020-09-14 DIAGNOSIS — Z79899 Other long term (current) drug therapy: Secondary | ICD-10-CM | POA: Diagnosis not present

## 2020-09-14 DIAGNOSIS — Z024 Encounter for examination for driving license: Secondary | ICD-10-CM

## 2020-09-14 DIAGNOSIS — I2510 Atherosclerotic heart disease of native coronary artery without angina pectoris: Secondary | ICD-10-CM

## 2020-09-14 DIAGNOSIS — E1059 Type 1 diabetes mellitus with other circulatory complications: Secondary | ICD-10-CM

## 2020-09-14 DIAGNOSIS — E785 Hyperlipidemia, unspecified: Secondary | ICD-10-CM

## 2020-09-14 DIAGNOSIS — I10 Essential (primary) hypertension: Secondary | ICD-10-CM | POA: Diagnosis not present

## 2020-09-14 NOTE — Patient Instructions (Signed)
Medication Instructions:  The current medical regimen is effective;  continue present plan and medications as directed. Please refer to the Current Medication list given to you today.  *If you need a refill on your cardiac medications before your next appointment, please call your pharmacy*  Lab Work: FASTING LIPID,LFT,BMET,A1C AND CBCWHEN FASTING/STRESS TEST DAY If you have labs (blood work) drawn today and your tests are completely normal, you will receive your results only by:  MyChart Message (if you have MyChart) OR A paper copy in the mail.  If you have any lab test that is abnormal or we need to change your treatment, we will call you to review the results. You may go to any [facility] that is convenient for you however, we do have a lab in our office that is able to assist you. You DO NOT need an appointment for our lab. The lab is open 8:00am and closes at 4:00pm. Lunch 12:45 - 1:45pm.  Testing/Procedures: Your physician has requested that you have an Exercise Myoview.(PLEASE SCHEDULE STRESS TEST BEFORE SCHEDULED FOLLOW UP APPOINTMENT) A cardiac stress test is a cardiological test that measures the heart's ability to respond to external stress in a controlled clinical environment. The stress response is induced by exercise (exercise-treadmill). For further information please visit [email or web address] If you have questions or concerns about your appointment, you can call the Nuclear Lab at [phone].  Hold: METOPROLOL the day of the testing    Follow-Up: Your next appointment:  KEEP SCHEDULED APPOINTMENT  In Person with Nicki Guadalajara, MD    At [facility], you and your health needs are our priority.  As part of our continuing mission to provide you with exceptional heart care, we have created designated Provider Care Teams.  These Care Teams include your primary Cardiologist (physician) and Advanced Practice Providers (APPs -  Physician Assistants and Nurse Practitioners) who all work  together to provide you with the care you need, when you need it.  We recommend signing up for the patient portal called "MyChart".  Sign up information is provided on this After Visit Summary.  MyChart is used to connect with patients for Virtual Visits (Telemedicine).  Patients are able to view lab/test results, encounter notes, upcoming appointments, etc.  Non-urgent messages can be sent to your provider as well.   To learn more about what you can do with MyChart, go to [email or web address].

## 2020-09-18 ENCOUNTER — Telehealth (HOSPITAL_COMMUNITY): Payer: Self-pay | Admitting: *Deleted

## 2020-09-18 ENCOUNTER — Encounter: Payer: Self-pay | Admitting: Cardiovascular Disease

## 2020-09-18 ENCOUNTER — Ambulatory Visit (INDEPENDENT_AMBULATORY_CARE_PROVIDER_SITE_OTHER): Payer: 59 | Admitting: Cardiovascular Disease

## 2020-09-18 ENCOUNTER — Other Ambulatory Visit: Payer: Self-pay

## 2020-09-18 VITALS — BP 122/72 | HR 74 | Ht 72.0 in | Wt 237.8 lb

## 2020-09-18 DIAGNOSIS — E785 Hyperlipidemia, unspecified: Secondary | ICD-10-CM

## 2020-09-18 DIAGNOSIS — F419 Anxiety disorder, unspecified: Secondary | ICD-10-CM

## 2020-09-18 DIAGNOSIS — I10 Essential (primary) hypertension: Secondary | ICD-10-CM | POA: Diagnosis not present

## 2020-09-18 DIAGNOSIS — I2510 Atherosclerotic heart disease of native coronary artery without angina pectoris: Secondary | ICD-10-CM

## 2020-09-18 DIAGNOSIS — E119 Type 2 diabetes mellitus without complications: Secondary | ICD-10-CM

## 2020-09-18 DIAGNOSIS — F32A Depression, unspecified: Secondary | ICD-10-CM

## 2020-09-18 NOTE — Patient Instructions (Signed)
Medication Instructions:  Continue same medications *If you need a refill on your cardiac medications before your next appointment, please call your pharmacy*   Lab Work: None ordered   Testing/Procedures: None ordered   Follow-Up: At [facility], you and your health needs are our priority.  As part of our continuing mission to provide you with exceptional heart care, we have created designated Provider Care Teams.  These Care Teams include your primary Cardiologist (physician) and Advanced Practice Providers (APPs -  Physician Assistants and Nurse Practitioners) who all work together to provide you with the care you need, when you need it.  We recommend signing up for the patient portal called "MyChart".  Sign up information is provided on this After Visit Summary.  MyChart is used to connect with patients for Virtual Visits (Telemedicine).  Patients are able to view lab/test results, encounter notes, upcoming appointments, etc.  Non-urgent messages can be sent to your provider as well.   To learn more about what you can do with MyChart, go to [email or web address].    Your next appointment:  1 year    Call in Feb to schedule May appointment    The format for your next appointment: Office     Provider:  [facility]

## 2020-09-18 NOTE — Progress Notes (Signed)
Patient ID: [name], male   DOB: 1946-03-05, 75 y.o.   MRN: [id number]    Primary M.D.: Dr. Gilford Rile  HPI: [name] is a 75 y.o. male who presents for a 55 month follow-up cardiology evaluation.  Mr. Usery has CAD and in 2002 initially underwent stenting to his RCA. In March 2010 he underwent cutting balloon and stenting to his LAD ostium as well as mid LAD and at that time his RCA stent was patent but he did have a 60-70% stenosis beyond the stented segment. 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 His last nuclear perfusion scan was on Oct 13, 2014 which continued to show normal perfusion without scar or ischemia.  He developed mild ST segment changes and had occasional PVCs but scintigraphic images continues to show entirely normal perfusion without evidence for scar or ischemia.  Post stress ejection fraction was 66% and he had normal wall motion  He has a history of diabetes mellitus as well as hyperlipidemia. He has been taking Crestor 20 mg daily and is no longer taking Zetia for his lipids.  Previously he was  taking a full dose 325 aspirin plus Plavix for his dual antiplatelet therapy.  I had recommended reduction of his aspirin to 81 mg.  Apparently he decided to stop his aspirin altogether. He is on isosorbide mononitrate 60 mg and Toprol-XL 25 mg for his cardiac structure disease and he continues to take metformin 500 mg 3 times daily for his diabetes mellitus. He also takes over-the-counter vitamin D 2000 units daily as well as fish oil 2400 mg.  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  I saw him in May 2018.  He requires every 2 year nuclear stress test per DOT physical which was done on 10/07/2016 and showed normal perfusion without scar or ischemia.  EF was 62%.  There were no ECG changes noted during  stress.    I last saw him in March 2020 at which time he remained stable and specifically denied any chest pain or shortness of breath. He continued to work and in May 2020 will again require a 71-year-old follow-up DOT mandated nuclear stress test.  He was on rosuvastatin 20 mg for hyperlipidemia.  He is on Toprol-XL 25 alternating with 50 mg and isosorbide 60 mg for CAD.  He was on clopidogrel.  He continued to be on Lexapro for depression and metformin for diabetes.    His DOT physical nuclear perfusion study in May 2020 was low risk which showed normal EF with normal wall motion.  There was a minimal apical defect most likely due to apical artifact without ischemia.  The study was unchanged from 2018.  Presently, he continues to feel well.  He again was in need for DOT nuclear stress test.  He had seen Jory Sims, NP last week and is scheduled to undergo this test on Sep 20, 2020.  He continues to drive truck without difficulty.  He typically drive 846 miles a day with trips to [place].  He comes home every night and sleeps at home.  During his long driving days he does not get the opportunity to exercise.  He will be turning 75 years old in June and plans to retire later this year.  He denies chest pain PND orthopnea.  His blood pressure has been stable.  He presents for evaluation.    Past Medical History:  Diagnosis Date  . Coronary artery disease      Past Surgical History:  Procedure Laterality Date  . CARDIAC CATHETERIZATION  08/03/2008   Cutting balloon atherotomy and stenting of LAD and mid LAD. Stented with a 2.5x55m Xience and posrdilated at 2.78. The 80% lesion was reduced to 0%.  . CARDIAC CATHETERIZATION  05/06/2001   Stenting of 99% mid RCA lesion performed with a 3.5x187mZeta stent at 13 atm resulting in reduction of 99% stenosis to 0% with TIMI2-3 flow.  . Marland KitchenARDIOVASCULAR STRESS TEST  09/30/2011   No significant reversible perfusion defects. No ECG changes, non-diagnostic  for ischemia.  . TRANSTHORACIC ECHOCARDIOGRAM  11/25/2004   EF 50-55%, mild mitral valve regurg, mild tricuspid valve regurg, LA-mildy dilated    Allergies  Allergen Reactions  . Codeine Nausea Only    Current Outpatient Medications  Medication Sig Dispense Refill  . clopidogrel (PLAVIX) 75 MG tablet TAKE ONE TABLET BY MOUTH ONCE DAILY 90 tablet 3  . escitalopram (LEXAPRO) 20 MG tablet Take 20 mg by mouth daily.    . isosorbide mononitrate (IMDUR) 60 MG 24 hr tablet TAKE 1 TABLET (60 MG TOTAL) BY MOUTH DAILY. 90 tablet 3  . metFORMIN (GLUCOPHAGE) 500 MG tablet Take 1 tablet by mouth 3 (three) times daily.    . metoprolol succinate (TOPROL-XL) 50 MG 24 hr tablet Take 1 tablet (50 mg total) by mouth daily. 45 tablet 3  . rosuvastatin (CRESTOR) 20 MG tablet Take 1 tablet (20 mg total) by mouth daily. 90 tablet 3   No current facility-administered medications for this visit.    Social History   Socioeconomic History  . Marital status: Married    Spouse name: Not on file  . Number of children: Not on file  . Years of education: Not on file  . Highest education level: Not on file  Occupational History  . Not on file  Tobacco Use  . Smoking status: Former Smoker    Packs/day: 2.00    Years: 2.00    Pack years: 4.00    Types: Cigarettes  . Smokeless tobacco: Never Used  . Tobacco comment: quit smoking in 1987  Substance and Sexual Activity  . Alcohol use: No    Comment: quit drinking in1990.  . Drug use: Not on file  . Sexual activity: Not on file  Other Topics Concern  . Not on file  Social History Narrative  . Not on file   Social Determinants of Health   Financial Resource Strain: Not on file  Food Insecurity: Not on file  Transportation Needs: Not on file  Physical Activity: Not on file  Stress: Not on file  Social Connections: Not on file  Intimate Partner Violence: Not on file   Social history are his 3 children 8 grandchildren 2 great-grandchildren. He does  not routinely exercise. He spends many hours a day and a truck. He denies any significant awareness of weight change.  Family History  Problem Relation Age of Onset  . Heart disease Father    ROS General: Negative; No fevers, chills, or night sweats;  HEENT: Negative; No changes in vision or hearing, sinus congestion, difficulty swallowing Pulmonary: Negative; No cough, wheezing, shortness of breath, hemoptysis Cardiovascular: Negative; No chest pain, presyncope, syncope, palpitations GI: Negative; No nausea, vomiting, diarrhea, or abdominal pain GU: Negative; No dysuria, hematuria, or difficulty voiding Musculoskeletal: Negative; no myalgias,  joint pain, or weakness Hematologic/Oncology: Negative; no easy bruising, bleeding Endocrine:  Positive for diabetes Neuro: Negative; no changes in balance, headaches Skin: Negative; No rashes or skin lesions Psychiatric: Negative; No behavioral problems, depression Sleep: Negative; No snoring, daytime sleepiness, hypersomnolence, bruxism, restless legs, hypnogognic hallucinations, no cataplexy Other comprehensive 14 point system review is negative.  Physical Exam BP 122/72   Pulse 74   Ht 6' (1.829 m)   Wt 237 lb 12.8 oz (107.9 kg)   SpO2 96%   BMI 32.25 kg/m    Repeat blood pressure by me was 130/68  Wt Readings from Last 3 Encounters:  09/18/20 237 lb 12.8 oz (107.9 kg)  09/14/20 235 lb 9.6 oz (106.9 kg)  09/28/18 235 lb (106.6 kg)   General: Alert, oriented, no distress.  Skin: normal turgor, no rashes, warm and dry HEENT: Normocephalic, atraumatic. Pupils equal round and reactive to light; sclera anicteric; extraocular muscles intact;  Nose without nasal septal hypertrophy Mouth/Parynx benign; Mallinpatti scale 3 Neck: No JVD, no carotid bruits; normal carotid upstroke Lungs: clear to ausculatation and percussion; no wheezing or rales Chest wall: without tenderness to palpitation Heart: PMI not displaced, RRR, s1 s2 normal,  1/6 systolic murmur, no diastolic murmur, no rubs, gallops, thrills, or heaves Abdomen: soft, nontender; no hepatosplenomehaly, BS+; abdominal aorta nontender and not dilated by palpation. Back: no CVA tenderness Pulses 2+ Musculoskeletal: full range of motion, normal strength, no joint deformities Extremities: no clubbing cyanosis or edema, Homan's sign negative  Neurologic: grossly nonfocal; Cranial nerves grossly wnl Psychologic: Normal mood and affect   April 29 ECG (independently read by me): Normal sinus rhythm at 85 bpm.  No significant ST-T changes.  Normal intervals with a QTc interval of 421 ms and PR interval at 154 ms.  March 2020 ECG (independently read by me): Normal sinus rhythm with frequent PVCs with transient bigeminy.  QTc interval 442 ms.  PR 160 ms.  May 2018 ECG (independently read by me): Normal sinus rhythm at 78 bpm.  Normal intervals.  No ST segment changes.  10/10/2015 ECG (independently read by me): Normal sinus rhythm at 74 bpm.  No ectopy.  QTc interval 444 ms.  May 2016 ECG (independently read by me): sinus rhythm at 85 bpm.  February 2015 ECG (independently read by me): Sinus rhythm at 71 beats per minute. No significant ST changes   LABS: BMP Latest Ref Rng & Units 07/15/2013 08/04/2008  Glucose 70 - 99 mg/dL 125(H) 136(H)  BUN 6 - 23 mg/dL 13 9  Creatinine 0.50 - 1.35 mg/dL 1.08 1.19  Sodium 135 - 145 mEq/L 138 136  Potassium 3.5 - 5.3 mEq/L 4.6 3.8  Chloride 96 - 112 mEq/L 103 106  CO2 19 - 32 mEq/L 28 24  Calcium 8.4 - 10.5 mg/dL 9.6 8.4   Hepatic Function Latest Ref Rng & Units 07/15/2013  Total Protein 6.0 - 8.3 g/dL 7.2  Albumin 3.5 - 5.2 g/dL 4.5  AST 0 - 37 U/L 22  ALT 0 - 53 U/L 19  Alk Phosphatase 39 - 117 U/L 53  Total Bilirubin 0.2 - 1.2 mg/dL 0.5   CBC Latest Ref Rng & Units 07/15/2013 08/04/2008 08/03/2008  WBC 4.0 - 10.5 K/uL 8.8 7.9 6.5  Hemoglobin 13.0 - 17.0 g/dL 13.9 12.8(L) 13.1  Hematocrit 39.0 - 52.0 % 41.4 36.5(L) 38.2(L)   Platelets 150 - 400 K/uL 269 218 214   Lab Results  Component Value Date   TSH 2.324 07/15/2013   Lab  Results  Component Value Date   HGBA1C 7.4 (H) 07/15/2013   Lipid Panel     Component Value Date/Time   CHOL 125 07/15/2013 1615   TRIG 149 07/15/2013 1615   HDL 46 07/15/2013 1615   CHOLHDL 2.7 07/15/2013 1615   VLDL 30 07/15/2013 1615   LDLCALC 49 07/15/2013 1615    RADIOLOGY: No results found.  IMPRESSION: 1. Coronary artery disease involving native coronary artery of native heart without angina pectoris   2. Essential hypertension   3. Hyperlipidemia with target LDL less than 70   4. Type 2 diabetes mellitus without complication, without long-term current use of insulin ([place])   5. Anxiety and depression     ASSESSMENT AND PLAN: Mr. 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  He is 20 years status post initial stenting to his RCA in 2002, and 12 years status post intervention to his LAD ostium and mid LAD in 2010.  He continues to drive a truck and as such is required by the Department of Transportation to undergo 2-year follow-up nuclear imaging studies.  His study in May 2018 showed normal perfusion with an EF of 62%.  And his last study in May 2022 continue to be low risk without scar or ischemia.  When I last saw him in March 2020 he was having occasional PVCs and I suggested further titration of his beta-blocker regimen.  He is now on Toprol-XL 50 mg daily in addition to isosorbide 60 mg.  He is not having any awareness of palpitations.  His ECG done last week when seen by Jory Sims, NP showed normal sinus rhythm at 85 bpm without ectopy.  He continues to be on rosuvastatin 20 mg for hyperlipidemia.  He has continued to be on clopidogrel 75 mg and is tolerating this well.  He does not take aspirin.  He is on escitalopram 20 mg daily for depression/anxiety.  He is scheduled to undergo a 2-year follow-up DOT  physical required nuclear perfusion study later this week.  I will contact him regarding results.  I reviewed his most recent laboratory done at [facility] in August 2021 showed an LDL cholesterol at 86.  Target LDL is less than 70 with his established CAD.  This will be rechecked by his primary physician and if this remains elevated I would recommend further titration of rosuvastatin to 40 mg daily or add Zetia 10 mg to his current 20 mg at the rosuvastatin dose.  He plans to retire later this year and as result this most likely will be his last DOT requirement.  He will continue his current medical regimen.  I will see him in 1 year for reevaluation or sooner as needed.   Troy Sine, MD, [facility]  09/20/2020 12:24 PM

## 2020-09-18 NOTE — Telephone Encounter (Signed)
Close encounter 

## 2020-09-20 ENCOUNTER — Ambulatory Visit (HOSPITAL_COMMUNITY)
Admission: RE | Admit: 2020-09-20 | Discharge: 2020-09-20 | Disposition: A | Discharge status: Home / Self Care | Payer: 59 | Source: Ambulatory Visit | Attending: Cardiology | Admitting: Cardiology

## 2020-09-20 ENCOUNTER — Other Ambulatory Visit: Payer: Self-pay

## 2020-09-20 ENCOUNTER — Encounter: Payer: Self-pay | Admitting: Cardiovascular Disease

## 2020-09-20 DIAGNOSIS — Z024 Encounter for examination for driving license: Secondary | ICD-10-CM | POA: Diagnosis present

## 2020-09-20 DIAGNOSIS — I2510 Atherosclerotic heart disease of native coronary artery without angina pectoris: Secondary | ICD-10-CM | POA: Diagnosis present

## 2020-09-20 MED ORDER — TECHNETIUM TC 99M TETROFOSMIN IV KIT
9.3000 | PACK | Freq: Once | INTRAVENOUS | Status: AC | PRN
  Administered 2020-09-20: 9.3 via INTRAVENOUS
  Filled 2020-09-20: qty 10

## 2020-09-20 MED ORDER — TECHNETIUM TC 99M TETROFOSMIN IV KIT
30.6000 | PACK | Freq: Once | INTRAVENOUS | Status: AC | PRN
  Administered 2020-09-20: 30.6 via INTRAVENOUS
  Filled 2020-09-20: qty 31

## 2020-09-20 MED ORDER — REGADENOSON 0.4 MG/5ML IV SOLN
0.4000 mg | Freq: Once | INTRAVENOUS | Status: AC
  Administered 2020-09-20: 0.4 mg via INTRAVENOUS

## 2020-09-20 NOTE — Addendum Note (Signed)
Addended by: Nicki Guadalajara A on: 09/20/2020 01:47 PM   Modules accepted: Orders

## 2020-09-20 NOTE — Addendum Note (Signed)
Addended by: Bernita Buffy on: 09/20/2020 01:36 PM   Modules accepted: Orders

## 2020-09-21 ENCOUNTER — Encounter (HOSPITAL_COMMUNITY): Payer: 59

## 2020-09-21 LAB — LIPID PANEL
Chol/HDL Ratio: 3.1 ratio (ref 0.0–5.0)
Cholesterol, Total: 174 mg/dL (ref 100–199)
HDL: 57 mg/dL (ref >39)
LDL Chol Calc (NIH): 87 mg/dL (ref 0–99)
Triglycerides: 175 mg/dL — ABNORMAL HIGH (ref 0–149)
VLDL Cholesterol Cal: 30 mg/dL (ref 5–40)

## 2020-09-21 LAB — HEPATIC FUNCTION PANEL
ALT: 22 IU/L (ref 0–44)
AST: 21 IU/L (ref 0–40)
Albumin: 4.9 g/dL — ABNORMAL HIGH (ref 3.7–4.7)
Alkaline Phosphatase: 72 IU/L (ref 44–121)
Bilirubin Total: 0.5 mg/dL (ref 0.0–1.2)
Bilirubin, Direct: 0.14 mg/dL (ref 0.00–0.40)
Total Protein: 7.6 g/dL (ref 6.0–8.5)

## 2020-09-21 LAB — MYOCARDIAL PERFUSION IMAGING
LV dias vol: 83 mL (ref 62–150)
LV sys vol: 30 mL
Peak HR: 68 {beats}/min
Rest HR: 58 {beats}/min
SDS: 1
SRS: 3
SSS: 4
TID: 1.01

## 2020-09-21 LAB — CBC
Hematocrit: 44.6 % (ref 37.5–51.0)
Hemoglobin: 14.9 g/dL (ref 13.0–17.7)
MCH: 29.9 pg (ref 26.6–33.0)
MCHC: 33.4 g/dL (ref 31.5–35.7)
MCV: 89 fL (ref 79–97)
Platelets: 275 10*3/uL (ref 150–450)
RBC: 4.99 x10E6/uL (ref 4.14–5.80)
RDW: 12.9 % (ref 11.6–15.4)
WBC: 9.4 10*3/uL (ref 3.4–10.8)

## 2020-09-21 LAB — HEMOGLOBIN A1C
Est. average glucose Bld gHb Est-mCnc: 217 mg/dL
Hgb A1c MFr Bld: 9.2 % — ABNORMAL HIGH (ref 4.8–5.6)

## 2020-09-21 LAB — BASIC METABOLIC PANEL
BUN/Creatinine Ratio: 11 (ref 10–24)
BUN: 13 mg/dL (ref 8–27)
CO2: 24 mmol/L (ref 20–29)
Calcium: 9.9 mg/dL (ref 8.6–10.2)
Chloride: 100 mmol/L (ref 96–106)
Creatinine, Ser: 1.21 mg/dL (ref 0.76–1.27)
Glucose: 190 mg/dL — ABNORMAL HIGH (ref 65–99)
Potassium: 5.7 mmol/L — ABNORMAL HIGH (ref 3.5–5.2)
Sodium: 138 mmol/L (ref 134–144)
eGFR: 63 mL/min/{1.73_m2} (ref >59)

## 2020-09-26 ENCOUNTER — Telehealth: Payer: Self-pay | Admitting: Adult Health

## 2020-09-26 NOTE — Telephone Encounter (Signed)
Patient was calling to get his results. Please call back

## 2020-09-26 NOTE — Telephone Encounter (Signed)
Spoke with pt. 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 Will forward to NP.

## 2020-09-27 NOTE — Telephone Encounter (Signed)
    Pt is returning call for result 

## 2020-09-27 NOTE — Telephone Encounter (Signed)
This RN called patient at [phone], relayed result and other information from Joni Reining, NP's result note. Patient verbalized understanding, all questions and concerns addressed at this time.

## 2021-10-03 ENCOUNTER — Telehealth: Payer: Self-pay | Admitting: Cardiovascular Disease

## 2021-10-03 NOTE — Telephone Encounter (Signed)
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 They switch companies and he has to show proof he had one completed. I will place up front for patient to pick up. Patient verbalized understanding.

## 2021-10-03 NOTE — Telephone Encounter (Signed)
  Pt is requesting to speak with Dr. Landry Dyke nurse. He said, its regarding his [employer] paperwork.

## 2021-12-30 ENCOUNTER — Ambulatory Visit: Payer: 59 | Admitting: Cardiovascular Disease

## 2022-02-21 ENCOUNTER — Ambulatory Visit: Payer: 59 | Admitting: Adult Health

## 2022-02-21 ENCOUNTER — Ambulatory Visit: Payer: 59 | Admitting: Nurse Practitioner

## 2022-08-25 ENCOUNTER — Ambulatory Visit: Payer: Medicare PPO | Admitting: Cardiovascular Disease
